# Patient Record
Sex: Female | Born: 1985 | Race: White | Hispanic: No | Marital: Married | Smoking: Never smoker
Health system: Southern US, Community
[De-identification: ages and names within clinical notes are randomized; demographics above are authoritative.]

## PROBLEM LIST (undated history)

## (undated) ENCOUNTER — Inpatient Hospital Stay (HOSPITAL_COMMUNITY): Payer: Self-pay

## (undated) DIAGNOSIS — F419 Anxiety disorder, unspecified: Secondary | ICD-10-CM

## (undated) DIAGNOSIS — O139 Gestational [pregnancy-induced] hypertension without significant proteinuria, unspecified trimester: Secondary | ICD-10-CM

## (undated) DIAGNOSIS — F32A Depression, unspecified: Secondary | ICD-10-CM

## (undated) DIAGNOSIS — J329 Chronic sinusitis, unspecified: Secondary | ICD-10-CM

## (undated) DIAGNOSIS — D696 Thrombocytopenia, unspecified: Secondary | ICD-10-CM

## (undated) DIAGNOSIS — F329 Major depressive disorder, single episode, unspecified: Secondary | ICD-10-CM

## (undated) DIAGNOSIS — O99119 Other diseases of the blood and blood-forming organs and certain disorders involving the immune mechanism complicating pregnancy, unspecified trimester: Secondary | ICD-10-CM

## (undated) HISTORY — DX: Other diseases of the blood and blood-forming organs and certain disorders involving the immune mechanism complicating pregnancy, unspecified trimester: D69.6

## (undated) HISTORY — PX: SINUS EXPLORATION: SHX5214

## (undated) HISTORY — PX: WISDOM TOOTH EXTRACTION: SHX21

## (undated) HISTORY — DX: Thrombocytopenia, unspecified: O99.119

---

## 2005-03-28 DIAGNOSIS — O1490 Unspecified pre-eclampsia, unspecified trimester: Secondary | ICD-10-CM

## 2005-08-27 ENCOUNTER — Inpatient Hospital Stay (HOSPITAL_COMMUNITY): Admission: AD | Admit: 2005-08-27 | Discharge: 2005-08-27 | Payer: Self-pay | Admitting: Obstetrics

## 2005-08-29 ENCOUNTER — Inpatient Hospital Stay (HOSPITAL_COMMUNITY): Admission: AD | Admit: 2005-08-29 | Discharge: 2005-09-01 | Payer: Self-pay | Admitting: Obstetrics

## 2007-11-29 ENCOUNTER — Ambulatory Visit (HOSPITAL_COMMUNITY): Admission: RE | Admit: 2007-11-29 | Discharge: 2007-11-29 | Payer: Self-pay | Admitting: Obstetrics & Gynecology

## 2008-01-29 ENCOUNTER — Inpatient Hospital Stay (HOSPITAL_COMMUNITY): Admission: AD | Admit: 2008-01-29 | Discharge: 2008-01-29 | Payer: Self-pay | Admitting: Obstetrics & Gynecology

## 2008-03-10 ENCOUNTER — Inpatient Hospital Stay (HOSPITAL_COMMUNITY): Admission: AD | Admit: 2008-03-10 | Discharge: 2008-03-10 | Payer: Self-pay | Admitting: Obstetrics

## 2008-03-28 DIAGNOSIS — O1490 Unspecified pre-eclampsia, unspecified trimester: Secondary | ICD-10-CM

## 2008-04-17 ENCOUNTER — Inpatient Hospital Stay (HOSPITAL_COMMUNITY): Admission: RE | Admit: 2008-04-17 | Discharge: 2008-04-20 | Payer: Self-pay | Admitting: Obstetrics & Gynecology

## 2010-07-12 LAB — CBC
HCT: 28.4 % — ABNORMAL LOW (ref 36.0–46.0)
HCT: 32.9 % — ABNORMAL LOW (ref 36.0–46.0)
Hemoglobin: 10.1 g/dL — ABNORMAL LOW (ref 12.0–15.0)
Hemoglobin: 11.7 g/dL — ABNORMAL LOW (ref 12.0–15.0)
MCHC: 35.5 g/dL (ref 30.0–36.0)
MCV: 98.7 fL (ref 78.0–100.0)
MCV: 99.9 fL (ref 78.0–100.0)
RBC: 3.34 MIL/uL — ABNORMAL LOW (ref 3.87–5.11)
RDW: 12.4 % (ref 11.5–15.5)
WBC: 11 10*3/uL — ABNORMAL HIGH (ref 4.0–10.5)

## 2010-07-12 LAB — RH IMMUNE GLOB WKUP(>/=20WKS)(NOT WOMEN'S HOSP)

## 2010-12-28 LAB — RH IMMUNE GLOBULIN WORKUP (NOT WOMEN'S HOSP)
ABO/RH(D): O NEG
Antibody Screen: NEGATIVE

## 2010-12-31 LAB — CBC
HCT: 34.9 % — ABNORMAL LOW (ref 36.0–46.0)
MCHC: 35.5 g/dL (ref 30.0–36.0)
MCV: 100.4 fL — ABNORMAL HIGH (ref 78.0–100.0)
Platelets: 112 10*3/uL — ABNORMAL LOW (ref 150–400)

## 2011-03-29 NOTE — L&D Delivery Note (Signed)
Delivery Note At 11:27 AM a viable female was delivered via Vaginal, Spontaneous Delivery (Presentation: Left Occiput Anterior).  APGAR: 9, 9; weight 6 lb 15.5 oz (3160 g).   Placenta status: Intact, Spontaneous.  Cord: 3 vessels with the following complications: None.  Cord pH: none  Anesthesia: Other Epidural  Episiotomy: None Lacerations: None Suture Repair: none Est. Blood Loss (mL): 350  Mom to postpartum.  Baby to nursery-stable.  HARPER,CHARLES A 11/03/2011, 1:04 PM

## 2011-05-17 LAB — OB RESULTS CONSOLE RUBELLA ANTIBODY, IGM: Rubella: IMMUNE

## 2011-05-17 LAB — OB RESULTS CONSOLE ABO/RH: RH Type: NEGATIVE

## 2011-05-17 LAB — OB RESULTS CONSOLE HIV ANTIBODY (ROUTINE TESTING): HIV: NONREACTIVE

## 2011-07-29 ENCOUNTER — Inpatient Hospital Stay (HOSPITAL_COMMUNITY)
Admission: AD | Admit: 2011-07-29 | Discharge: 2011-07-29 | Disposition: A | Discharge status: Home / Self Care | Payer: BC Managed Care – PPO | Source: Ambulatory Visit | Attending: Obstetrics & Gynecology | Admitting: Obstetrics & Gynecology

## 2011-07-29 ENCOUNTER — Encounter (HOSPITAL_COMMUNITY): Payer: Self-pay | Admitting: *Deleted

## 2011-07-29 DIAGNOSIS — O99891 Other specified diseases and conditions complicating pregnancy: Secondary | ICD-10-CM | POA: Insufficient documentation

## 2011-07-29 DIAGNOSIS — O26899 Other specified pregnancy related conditions, unspecified trimester: Secondary | ICD-10-CM

## 2011-07-29 DIAGNOSIS — R109 Unspecified abdominal pain: Secondary | ICD-10-CM | POA: Insufficient documentation

## 2011-07-29 HISTORY — DX: Major depressive disorder, single episode, unspecified: F32.9

## 2011-07-29 HISTORY — DX: Anxiety disorder, unspecified: F41.9

## 2011-07-29 HISTORY — DX: Depression, unspecified: F32.A

## 2011-07-29 HISTORY — DX: Chronic sinusitis, unspecified: J32.9

## 2011-07-29 LAB — WET PREP, GENITAL: Yeast Wet Prep HPF POC: NONE SEEN

## 2011-07-29 NOTE — MAU Provider Note (Signed)
  History     CSN: [id number]  Arrival date and time: 07/29/11 1246   None     Chief Complaint  Patient presents with  . Abdominal Cramping   HPI   Pt is [redacted]w[redacted]d pregnant and complains  Of lower abdominal cramping- worse today.  No leakage of fluid or bleeding. Pt denies pain with urination, constipation or diarrhea.  Past Medical History  Diagnosis Date  . Sinus infection   . Asthma     "sinus induced"  . Anxiety   . Depression     Past Surgical History  Procedure Date  . Sinus exploration   . Wisdom tooth extraction     History reviewed. No pertinent family history.  History  Substance Use Topics  . Smoking status: Never Smoker   . Smokeless tobacco: Never Used  . Alcohol Use: No    Allergies: No Known Allergies  Prescriptions prior to admission  Medication Sig Dispense Refill  . butalbital-acetaminophen-caffeine (FIORICET, ESGIC) 50-325-40 MG per tablet Take 1 tablet by mouth 2 (two) times daily as needed. For pain      . loperamide (IMODIUM) 2 MG capsule Take 2 mg by mouth once. For diarrhea      . loratadine (CLARITIN) 10 MG tablet Take 10 mg by mouth daily.      Marland Kitchen omeprazole (PRILOSEC) 20 MG capsule Take 20 mg by mouth every morning.      . Prenatal Vit-Fe Fumarate-FA (PRENATAL MULTIVITAMIN) TABS Take 1 tablet by mouth every morning.        Review of Systems  Constitutional: Negative for fever and chills.  Gastrointestinal: Positive for nausea and abdominal pain. Negative for vomiting, diarrhea and constipation.  Genitourinary: Negative for dysuria and urgency.  Neurological: Negative for headaches.   Physical Exam   Blood pressure 109/61, pulse 83, temperature 97.1 F (36.2 C), temperature source Oral, resp. rate 16.  Physical Exam  Nursing note and vitals reviewed. Constitutional: She appears well-developed and well-nourished.  HENT:  Head: Normocephalic.  Eyes: Pupils are equal, round, and reactive to light.  Neck: Normal range of motion. Neck  supple.  Cardiovascular: Normal rate.   Respiratory: Effort normal.  GI: Soft. There is no tenderness. There is no rebound and no guarding.       No contractions palpated or picked up on toco.  Genitourinary:       Cervix closed- wet prep obtained  Musculoskeletal: Normal range of motion.  Neurological: She is alert.  Skin: Skin is warm and dry.  Psychiatric: She has a normal mood and affect.    MAU Course  Procedures Fetal monitoring with 6-25bpm variability No ctx palpated or on toco Results for orders placed during the hospital encounter of 07/29/11 (from the past 24 hour(s))  WET PREP, GENITAL     Status: Abnormal   Collection Time   07/29/11  2:10 PM      Component Value Range   Yeast Wet Prep HPF POC NONE SEEN  NONE SEEN    Trich, Wet Prep NONE SEEN  NONE SEEN    Clue Cells Wet Prep HPF POC NONE SEEN  NONE SEEN    WBC, Wet Prep HPF POC FEW (*) NONE SEEN    Assessment and Plan  Round ligament pain F/u with OB appointment Kenitha Glendinning 07/29/2011, 3:23 PM

## 2011-07-29 NOTE — MAU Note (Signed)
Low abdominal cramping X 2 days worse last night, unable to sleep. Thick vaginal D/C with odor.

## 2011-08-11 ENCOUNTER — Other Ambulatory Visit: Payer: Self-pay

## 2011-08-15 ENCOUNTER — Encounter (HOSPITAL_COMMUNITY): Payer: Self-pay | Admitting: *Deleted

## 2011-08-15 ENCOUNTER — Inpatient Hospital Stay (HOSPITAL_COMMUNITY)
Admission: AD | Admit: 2011-08-15 | Discharge: 2011-08-15 | Disposition: A | Discharge status: Home / Self Care | Payer: BC Managed Care – PPO | Source: Ambulatory Visit | Attending: Obstetrics & Gynecology | Admitting: Obstetrics & Gynecology

## 2011-08-15 DIAGNOSIS — R109 Unspecified abdominal pain: Secondary | ICD-10-CM | POA: Insufficient documentation

## 2011-08-15 DIAGNOSIS — N949 Unspecified condition associated with female genital organs and menstrual cycle: Secondary | ICD-10-CM

## 2011-08-15 DIAGNOSIS — O26899 Other specified pregnancy related conditions, unspecified trimester: Secondary | ICD-10-CM

## 2011-08-15 DIAGNOSIS — O99891 Other specified diseases and conditions complicating pregnancy: Secondary | ICD-10-CM | POA: Insufficient documentation

## 2011-08-15 DIAGNOSIS — M545 Low back pain, unspecified: Secondary | ICD-10-CM | POA: Insufficient documentation

## 2011-08-15 DIAGNOSIS — O9989 Other specified diseases and conditions complicating pregnancy, childbirth and the puerperium: Secondary | ICD-10-CM

## 2011-08-15 LAB — URINALYSIS, ROUTINE W REFLEX MICROSCOPIC
Bilirubin Urine: NEGATIVE
Glucose, UA: NEGATIVE mg/dL
Ketones, ur: NEGATIVE mg/dL
Leukocytes, UA: NEGATIVE
Specific Gravity, Urine: 1.01 (ref 1.005–1.030)
pH: 6 (ref 5.0–8.0)

## 2011-08-15 MED ORDER — CYCLOBENZAPRINE HCL 5 MG PO TABS: 5.0000 mg | ORAL_TABLET | Freq: Three times a day (TID) | ORAL | Status: AC | PRN

## 2011-08-15 NOTE — MAU Note (Signed)
Patient states she has had intermittent low abdominal and low back pain since this am. Denies any bleeding or leaking and reports good fetal movement.

## 2011-08-15 NOTE — Discharge Instructions (Signed)
The medication for muscle pain may make you sleepy. Do not take if you are driving or doing any activity that may cause injury.  Back Pain in Pregnancy Back pain during pregnancy is common. It happens in about half of all pregnancies. It is important for you and your baby that you remain active during your pregnancy.If you feel that back pain is not allowing you to remain active or sleep well, it is time to see your caregiver. Back pain may be caused by several factors related to changes during your pregnancy.Fortunately, unless you had trouble with your back before your pregnancy, the pain is likely to get better after you deliver. Low back pain usually occurs between the fifth and seventh months of pregnancy. It can, however, happen in the first couple months. Factors that increase the risk of back problems include:   Previous back problems.   Injury to your back.   Having twins or multiple births.   A chronic cough.   Stress.   Job-related repetitive motions.   Muscle or spinal disease in the back.   Family history of back problems, ruptured (herniated) discs, or osteoporosis.   Depression, anxiety, and panic attacks.  CAUSES   When you are pregnant, your body produces a hormone called relaxin. This hormonemakes the ligaments connecting the low back and pubic bones more flexible. This flexibility allows the baby to be delivered more easily. When your ligaments are loose, your muscles need to work harder to support your back. Soreness in your back can come from tired muscles. Soreness can also come from back tissues that are irritated since they are receiving less support.   As the baby grows, it puts pressure on the nerves and blood vessels in your pelvis. This can cause back pain.   As the baby grows and gets heavier during pregnancy, the uterus pushes the stomach muscles forward and changes your center of gravity. This makes your back muscles work harder to maintain good posture.   SYMPTOMS  Lumbar pain during pregnancy Lumbar pain during pregnancy usually occurs at or above the waist in the center of the back. There may be pain and numbness that radiates into your leg or foot. This is similar to low back pain experienced by non-pregnant women. It usually increases with sitting for long periods of time, standing, or repetitive lifting. Tenderness may also be present in the muscles along your upper back. Posterior pelvic pain during pregnancy Pain in the back of the pelvis is more common than lumbar pain in pregnancy. It is a deep pain felt in your side at the waistline, or across the tailbone (sacrum), or in both places. You may have pain on one or both sides. This pain can also go into the buttocks and backs of the upper thighs. Pubic and groin pain may also be present. The pain does not quickly resolve with rest, and morning stiffness may also be present. Pelvic pain during pregnancy can be brought on by most activities. A high level of fitness before and during pregnancy may or may not prevent this problem. Labor pain is usually 1 to 2 minutes apart, lasts for about 1 minute, and involves a bearing down feeling or pressure in your pelvis. However, if you are at term with the pregnancy, constant low back pain can be the beginning of early labor, and you should be aware of this. DIAGNOSIS  X-rays of the back should not be done during the first 12 to 14 weeks of the  pregnancy and only when absolutely necessary during the rest of the pregnancy. MRIs do not give off radiation and are safe during pregnancy. MRIs also should only be done when absolutely necessary. HOME CARE INSTRUCTIONS  Exercise as directed by your caregiver. Exercise is the most effective way to prevent or manage back pain. If you have a back problem, it is especially important to avoid sports that require sudden body movements. Swimming and walking are great activities.   Do not stand in one place for long periods  of time.   Do not wear high heels.   Sit in chairs with good posture. Use a pillow on your lower back if necessary. Make sure your head rests over your shoulders and is not hanging forward.   Try sleeping on your side, preferably the left side, with a pillow or two between your legs. If you are sore after a night's rest, your bedmay betoo soft.Try placing a board between your mattress and box spring.   Listen to your body when lifting.If you are experiencing pain, ask for help or try bending yourknees more so you can use your leg muscles rather than your back muscles. Squat down when picking up something from the floor. Do not bend over.   Eat a healthy diet. Try to gain weight within your caregiver's recommendations.   Use heat or cold packs 3 to 4 times a day for 15 minutes to help with the pain.   Only take over-the-counter or prescription medicines for pain, discomfort, or fever as directed by your caregiver.  Sudden (acute) back pain  Use bed rest for only the most extreme, acute episodes of back pain. Prolonged bed rest over 48 hours will aggravate your condition.   Ice is very effective for acute conditions.   Put ice in a plastic bag.   Place a towel between your skin and the bag.   Leave the ice on for 10 to 20 minutes every 2 hours, or as needed.   Using heat packs for 30 minutes prior to activities is also helpful.  Continued back pain See your caregiver if you have continued problems. Your caregiver can help or refer you for appropriate physical therapy. With conditioning, most back problems can be avoided. Sometimes, a more serious issue may be the cause of back pain. You should be seen right away if new problems seem to be developing. Your caregiver may recommend:  A maternity girdle.   An elastic sling.   A back brace.   A massage therapist or acupuncture.  SEEK MEDICAL CARE IF:   You are not able to do most of your daily activities, even when taking the  pain medicine you were given.   You need a referral to a physical therapist or chiropractor.   You want to try acupuncture.  SEEK IMMEDIATE MEDICAL CARE IF:  You develop numbness, tingling, weakness, or problems with the use of your arms or legs.   You develop severe back pain that is no longer relieved with medicines.   You have a sudden change in bowel or bladder control.   You have increasing pain in other areas of the body.   You develop shortness of breath, dizziness, or fainting.   You develop nausea, vomiting, or sweating.   You have back pain which is similar to labor pains.   You have back pain along with your water breaking or vaginal bleeding.   You have back pain or numbness that travels down your leg.  Your back pain developed after you fell.   You develop pain on one side of your back. You may have a kidney stone.   You see blood in your urine. You may have a bladder infection or kidney stone.   You have back pain with blisters. You may have shingles.  Back pain is fairly common during pregnancy but should not be accepted as just part of the process. Back pain should always be treated as soon as possible. This will make your pregnancy as pleasant as possible. Document Released: 06/22/2005 Document Revised: 03/03/2011 Document Reviewed: 08/03/2010 [facility] Patient Information 2012 [place], [place].Back Pain in Pregnancy Back pain during pregnancy is common. It happens in about half of all pregnancies. It is important for you and your baby that you remain active during your pregnancy.If you feel that back pain is not allowing you to remain active or sleep well, it is time to see your caregiver. Back pain may be caused by several factors related to changes during your pregnancy.Fortunately, unless you had trouble with your back before your pregnancy, the pain is likely to get better after you deliver. Low back pain usually occurs between the fifth and seventh months  of pregnancy. It can, however, happen in the first couple months. Factors that increase the risk of back problems include:   Previous back problems.   Injury to your back.   Having twins or multiple births.   A chronic cough.   Stress.   Job-related repetitive motions.   Muscle or spinal disease in the back.   Family history of back problems, ruptured (herniated) discs, or osteoporosis.   Depression, anxiety, and panic attacks.  CAUSES   When you are pregnant, your body produces a hormone called relaxin. This hormonemakes the ligaments connecting the low back and pubic bones more flexible. This flexibility allows the baby to be delivered more easily. When your ligaments are loose, your muscles need to work harder to support your back. Soreness in your back can come from tired muscles. Soreness can also come from back tissues that are irritated since they are receiving less support.   As the baby grows, it puts pressure on the nerves and blood vessels in your pelvis. This can cause back pain.   As the baby grows and gets heavier during pregnancy, the uterus pushes the stomach muscles forward and changes your center of gravity. This makes your back muscles work harder to maintain good posture.  SYMPTOMS  Lumbar pain during pregnancy Lumbar pain during pregnancy usually occurs at or above the waist in the center of the back. There may be pain and numbness that radiates into your leg or foot. This is similar to low back pain experienced by non-pregnant women. It usually increases with sitting for long periods of time, standing, or repetitive lifting. Tenderness may also be present in the muscles along your upper back. Posterior pelvic pain during pregnancy Pain in the back of the pelvis is more common than lumbar pain in pregnancy. It is a deep pain felt in your side at the waistline, or across the tailbone (sacrum), or in both places. You may have pain on one or both sides. This pain can  also go into the buttocks and backs of the upper thighs. Pubic and groin pain may also be present. The pain does not quickly resolve with rest, and morning stiffness may also be present. Pelvic pain during pregnancy can be brought on by most activities. A high level of fitness before  and during pregnancy may or may not prevent this problem. Labor pain is usually 1 to 2 minutes apart, lasts for about 1 minute, and involves a bearing down feeling or pressure in your pelvis. However, if you are at term with the pregnancy, constant low back pain can be the beginning of early labor, and you should be aware of this. DIAGNOSIS  X-rays of the back should not be done during the first 12 to 14 weeks of the pregnancy and only when absolutely necessary during the rest of the pregnancy. MRIs do not give off radiation and are safe during pregnancy. MRIs also should only be done when absolutely necessary. HOME CARE INSTRUCTIONS  Exercise as directed by your caregiver. Exercise is the most effective way to prevent or manage back pain. If you have a back problem, it is especially important to avoid sports that require sudden body movements. Swimming and walking are great activities.   Do not stand in one place for long periods of time.   Do not wear high heels.   Sit in chairs with good posture. Use a pillow on your lower back if necessary. Make sure your head rests over your shoulders and is not hanging forward.   Try sleeping on your side, preferably the left side, with a pillow or two between your legs. If you are sore after a night's rest, your bedmay betoo soft.Try placing a board between your mattress and box spring.   Listen to your body when lifting.If you are experiencing pain, ask for help or try bending yourknees more so you can use your leg muscles rather than your back muscles. Squat down when picking up something from the floor. Do not bend over.   Eat a healthy diet. Try to gain weight within  your caregiver's recommendations.   Use heat or cold packs 3 to 4 times a day for 15 minutes to help with the pain.   Only take over-the-counter or prescription medicines for pain, discomfort, or fever as directed by your caregiver.  Sudden (acute) back pain  Use bed rest for only the most extreme, acute episodes of back pain. Prolonged bed rest over 48 hours will aggravate your condition.   Ice is very effective for acute conditions.   Put ice in a plastic bag.   Place a towel between your skin and the bag.   Leave the ice on for 10 to 20 minutes every 2 hours, or as needed.   Using heat packs for 30 minutes prior to activities is also helpful.  Continued back pain See your caregiver if you have continued problems. Your caregiver can help or refer you for appropriate physical therapy. With conditioning, most back problems can be avoided. Sometimes, a more serious issue may be the cause of back pain. You should be seen right away if new problems seem to be developing. Your caregiver may recommend:  A maternity girdle.   An elastic sling.   A back brace.   A massage therapist or acupuncture.  SEEK MEDICAL CARE IF:   You are not able to do most of your daily activities, even when taking the pain medicine you were given.   You need a referral to a physical therapist or chiropractor.   You want to try acupuncture.  SEEK IMMEDIATE MEDICAL CARE IF:  You develop numbness, tingling, weakness, or problems with the use of your arms or legs.   You develop severe back pain that is no longer relieved with medicines.  You have a sudden change in bowel or bladder control.   You have increasing pain in other areas of the body.   You develop shortness of breath, dizziness, or fainting.   You develop nausea, vomiting, or sweating.   You have back pain which is similar to labor pains.   You have back pain along with your water breaking or vaginal bleeding.   You have back pain or  numbness that travels down your leg.   Your back pain developed after you fell.   You develop pain on one side of your back. You may have a kidney stone.   You see blood in your urine. You may have a bladder infection or kidney stone.   You have back pain with blisters. You may have shingles.  Back pain is fairly common during pregnancy but should not be accepted as just part of the process. Back pain should always be treated as soon as possible. This will make your pregnancy as pleasant as possible. Document Released: 06/22/2005 Document Revised: 03/03/2011 Document Reviewed: 08/03/2010 [facility] Patient Information 2012 [place], [place].

## 2011-08-15 NOTE — MAU Provider Note (Signed)
History     CSN: [id number]  Arrival date & time 08/15/11  1813   None     Chief Complaint  Patient presents with  . Abdominal Pain  . Back Pain    HPI [name] is a 26 y.o. female @ [redacted]w[redacted]d gestation who presents to MAU for lower abdominal pain that radiates to her back. The pain started this morning and has continued. She describes the pain as sharp that comes and goes. It last about 2 minutes when it comes. The pain has come several times today. The pain increases with standing and walking. Problems with constipation. The history was provided by the patient.  Past Medical History  Diagnosis Date  . Sinus infection   . Asthma     "sinus induced"  . Anxiety   . Depression     Past Surgical History  Procedure Date  . Sinus exploration   . Wisdom tooth extraction     Family History  Problem Relation Age of Onset  . Anesthesia problems Neg Hx     History  Substance Use Topics  . Smoking status: Never Smoker   . Smokeless tobacco: Never Used  . Alcohol Use: No    OB History    Grav Para Term Preterm Abortions TAB SAB Ect Mult Living   3 2 2  0 0 0 0 0 0 2      Review of Systems  Constitutional: Negative for fever, chills, diaphoresis and fatigue.  HENT: Negative for ear pain, congestion, sore throat, facial swelling, neck pain, neck stiffness, dental problem and sinus pressure.   Eyes: Negative for photophobia, pain and discharge.  Respiratory: Negative for cough, chest tightness and wheezing.   Cardiovascular: Negative for chest pain, palpitations and leg swelling.  Gastrointestinal: Positive for nausea, abdominal pain and constipation. Negative for vomiting, diarrhea and abdominal distention.  Genitourinary: Positive for pelvic pain. Negative for dysuria, urgency, frequency, flank pain, vaginal bleeding, vaginal discharge and difficulty urinating.  Musculoskeletal: Positive for back pain. Negative for myalgias and gait problem.  Skin: Negative  for color change and rash.  Neurological: Positive for light-headedness and headaches. Negative for dizziness, speech difficulty, weakness and numbness.  Psychiatric/Behavioral: Negative for confusion and agitation.    Allergies  Review of patient's allergies indicates no known allergies.  Home Medications  No current outpatient prescriptions on file.  BP 126/80  Pulse 91  Temp(Src) 97.4 F (36.3 C) (Oral)  Resp 16  Ht 5\' 5"  (1.651 m)  Wt 201 lb 12.8 oz (91.536 kg)  BMI 33.58 kg/m2  SpO2 100%  Physical Exam  Nursing note and vitals reviewed. Constitutional: She is oriented to person, place, and time. She appears well-developed and well-nourished.  HENT:  Head: Normocephalic.  Eyes: EOM are normal.  Neck: Neck supple.  Cardiovascular: Normal rate.   Pulmonary/Chest: Effort normal.  Abdominal: Soft. There is no tenderness.       Gravid, consistent with dates  Genitourinary:       External genitalia without lesions, mucous discharge vaginal vault. Cervix closed, thick and high. Uterus consistent with dates.  Musculoskeletal: Normal range of motion.       Pain lower lumbar area with palpation and range of motion. Ambulatory without problems.  Neurological: She is alert and oriented to person, place, and time. She has normal strength and normal reflexes. No cranial nerve deficit or sensory deficit. Gait normal.  Skin: Skin is warm and dry.  Psychiatric: She has a normal mood and affect. Her  behavior is normal. Judgment normal.   EFM: No contractions, reactive tracing. ED Course  Procedures   MDM: discussed with Dr. Gaynell Face. Will treat as muscle strain.    Assessment: Low back strain   Round ligament pain  Plan:  Tylenol prn   Flexeril 5 mg. PO tid   Follow up with Dr. Clearance Coots   Return here as needed.

## 2011-08-16 ENCOUNTER — Other Ambulatory Visit: Payer: Self-pay | Admitting: Obstetrics

## 2011-08-26 ENCOUNTER — Other Ambulatory Visit: Payer: Self-pay | Admitting: Obstetrics

## 2011-08-26 DIAGNOSIS — O4190X0 Disorder of amniotic fluid and membranes, unspecified, unspecified trimester, not applicable or unspecified: Secondary | ICD-10-CM

## 2011-08-29 ENCOUNTER — Other Ambulatory Visit: Payer: Self-pay | Admitting: Obstetrics

## 2011-08-29 ENCOUNTER — Ambulatory Visit (HOSPITAL_COMMUNITY)
Admission: RE | Admit: 2011-08-29 | Discharge: 2011-08-29 | Disposition: A | Discharge status: Home / Self Care | Payer: BC Managed Care – PPO | Source: Ambulatory Visit | Attending: Obstetrics | Admitting: Obstetrics

## 2011-08-29 DIAGNOSIS — Z3689 Encounter for other specified antenatal screening: Secondary | ICD-10-CM | POA: Insufficient documentation

## 2011-08-29 DIAGNOSIS — O4190X0 Disorder of amniotic fluid and membranes, unspecified, unspecified trimester, not applicable or unspecified: Secondary | ICD-10-CM

## 2011-09-06 ENCOUNTER — Other Ambulatory Visit: Payer: Self-pay | Admitting: Obstetrics

## 2011-09-06 DIAGNOSIS — O99119 Other diseases of the blood and blood-forming organs and certain disorders involving the immune mechanism complicating pregnancy, unspecified trimester: Secondary | ICD-10-CM

## 2011-09-06 DIAGNOSIS — Z3689 Encounter for other specified antenatal screening: Secondary | ICD-10-CM

## 2011-09-09 ENCOUNTER — Ambulatory Visit (HOSPITAL_COMMUNITY)
Admission: RE | Admit: 2011-09-09 | Discharge: 2011-09-09 | Disposition: A | Discharge status: Home / Self Care | Payer: BC Managed Care – PPO | Source: Ambulatory Visit | Attending: Obstetrics | Admitting: Obstetrics

## 2011-09-09 ENCOUNTER — Other Ambulatory Visit: Payer: Self-pay | Admitting: Obstetrics

## 2011-09-09 ENCOUNTER — Inpatient Hospital Stay (HOSPITAL_COMMUNITY)
Admission: AD | Admit: 2011-09-09 | Discharge: 2011-09-09 | Disposition: A | Discharge status: Home / Self Care | Payer: BC Managed Care – PPO | Source: Ambulatory Visit | Attending: Obstetrics & Gynecology | Admitting: Obstetrics & Gynecology

## 2011-09-09 ENCOUNTER — Encounter (HOSPITAL_COMMUNITY): Payer: Self-pay

## 2011-09-09 DIAGNOSIS — Z298 Encounter for other specified prophylactic measures: Secondary | ICD-10-CM | POA: Insufficient documentation

## 2011-09-09 DIAGNOSIS — O99119 Other diseases of the blood and blood-forming organs and certain disorders involving the immune mechanism complicating pregnancy, unspecified trimester: Secondary | ICD-10-CM

## 2011-09-09 DIAGNOSIS — Z2989 Encounter for other specified prophylactic measures: Secondary | ICD-10-CM | POA: Insufficient documentation

## 2011-09-09 DIAGNOSIS — D689 Coagulation defect, unspecified: Secondary | ICD-10-CM | POA: Insufficient documentation

## 2011-09-09 DIAGNOSIS — Z3689 Encounter for other specified antenatal screening: Secondary | ICD-10-CM

## 2011-09-09 DIAGNOSIS — D696 Thrombocytopenia, unspecified: Secondary | ICD-10-CM

## 2011-09-09 MED ORDER — RHO D IMMUNE GLOBULIN 1500 UNIT/2ML IJ SOLN
300.0000 ug | Freq: Once | INTRAMUSCULAR | Status: AC
  Administered 2011-09-09: 300 ug via INTRAMUSCULAR
  Filled 2011-09-09: qty 2

## 2011-09-09 NOTE — MAU Note (Signed)
Dr. Tamela Oddi called [facility]:[id number] needing to be placed for pt sent over to receive rhophylac.

## 2011-09-09 NOTE — Progress Notes (Signed)
MFM note  [name] is a 26 yo G3P2002 currently at 57 5/7 weeks who is referred due to mild thrombocytopenia.  Her most recent plt count on 08/11/2011 was 116K.  Her new OB plts were 115K in February 2013.  Review of labs showed a plt count of 112 in 2009.  The patient reports that she has been followed for low platelets for about 3 years.  She reports that she was briefly given steroids for ? Low plts after her last delivery.  She had a limited work up several years ago by a physician in [place] (records not available for review at the time of consultation).  She states that there was some concern for Lupus, but that it was later felt to be negative.  She denies bleeding problems - no expistaxis.  She feels that she bruises easily, but no hx of hemorrhage or other problems.  Her prenatal course has otherwise been uncomplicated.  Her previous children did not have problems with low platelets after birth.  POB hx: Term SVD x 2.  First pregnancy complicated by elevated blood pressures.  PMH- as above. Migraine headaches. Denies arthritis/ arthalgias, skin rashes or photosensitivity  PSH- sinus surgery as a child - reports that she had a single nostril at birth that was repaired  Meds - Prenatal vitamins, Omeprazole, Fiorocet  Social - denies ETOH or tobacco abuse  Ultrasound:  Single IUP at 31 5/7 weeks On some views, there appears to be an isolated cleft palate but not confirmed on other images.  The lips / nose appear normal. The remainder of the anatomy appears within normal limits. The estimated fetal weight is appropriate (33rd percentile) Normal amniotic fluid volume   Impression/ Plan: 1) Mild thrombocytopenia - possible gestational thrombocytopenia.  Platelet counts have been stable throughout pregnancy and over the last several years.   Recommend checking plt counts monthly for the remainder of pregnancy.  We briefly discussed the possibility of not being a candidate for  epidural anesthesia if her plt count were < 100K.  If this becomes an issue, would recommend an anesthesia consult to address labor anesthesia.  Would not recommend treatment with steroids unless plts returned < 50K.   2) Possible isolated cleft palate - finding not confirmed on some images. Recommend that Peds be notified at the time of delivery to evaluate the newborn.  Will discuss a our monthly [facility] meeting.  Would not recommend further evaluation at this point.  Thank you for this referral.  Please contact our office if we can be of further assistance.  Alpha Gula, MD   I spent approximately 30 minutes with this patient with over 50% of time spent in face-to-face counseling

## 2011-09-09 NOTE — MAU Note (Signed)
Pt given and reviewed RH negative pamphlet.

## 2011-09-09 NOTE — MAU Note (Signed)
No adverse effect from rhophylac 

## 2011-09-10 LAB — RH IG WORKUP (INCLUDES ABO/RH)
ABO/RH(D): O NEG
Antibody Screen: NEGATIVE
Fetal Screen: NEGATIVE
Unit division: 0

## 2011-10-01 ENCOUNTER — Inpatient Hospital Stay (HOSPITAL_COMMUNITY)
Admission: AD | Admit: 2011-10-01 | Discharge: 2011-10-01 | Disposition: A | Discharge status: Home / Self Care | Payer: BC Managed Care – PPO | Source: Ambulatory Visit | Attending: Obstetrics | Admitting: Obstetrics

## 2011-10-01 ENCOUNTER — Encounter (HOSPITAL_COMMUNITY): Payer: Self-pay | Admitting: Obstetrics and Gynecology

## 2011-10-01 DIAGNOSIS — O99891 Other specified diseases and conditions complicating pregnancy: Secondary | ICD-10-CM | POA: Insufficient documentation

## 2011-10-01 DIAGNOSIS — O479 False labor, unspecified: Secondary | ICD-10-CM

## 2011-10-01 DIAGNOSIS — IMO0002 Reserved for concepts with insufficient information to code with codable children: Secondary | ICD-10-CM | POA: Insufficient documentation

## 2011-10-01 DIAGNOSIS — H538 Other visual disturbances: Secondary | ICD-10-CM | POA: Insufficient documentation

## 2011-10-01 LAB — URINALYSIS, ROUTINE W REFLEX MICROSCOPIC
Bilirubin Urine: NEGATIVE
Hgb urine dipstick: NEGATIVE
Protein, ur: NEGATIVE mg/dL
Urobilinogen, UA: 0.2 mg/dL (ref 0.0–1.0)

## 2011-10-01 NOTE — MAU Note (Signed)
Onset of fatigue and blurred vision for 3 days worse today, 34.6 gestation here for evaluation.

## 2011-10-01 NOTE — MAU Provider Note (Signed)
Lauren Garcia is a 26 y.o. female presenting for eval of fatigue and blurry vision x 3 days. Denies RUQ pain, headache, leaking or bldg. Reports +FM. States her diet has been mostly simple carbs lately and without protein. Her preg has been followed by the [facility] office and has been remarkable for thrombocytopenia and possibly cleft lip. She reports blurry vision with her first preg as well which resolved spontaneously after delivery. History OB History    Grav Para Term Preterm Abortions TAB SAB Ect Mult Living   3 2 2  0 0 0 0 0 0 2     Past Medical History  Diagnosis Date  . Sinus infection   . Asthma     "sinus induced"  . Anxiety   . Depression    Past Surgical History  Procedure Date  . Sinus exploration   . Wisdom tooth extraction    Family History: family history is negative for Anesthesia problems. Social History:  reports that she has never smoked. She has never used smokeless tobacco. She reports that she does not drink alcohol or use illicit drugs.    ROS    Blood pressure 109/73, pulse 112, temperature 97.8 F (36.6 C), temperature source Oral, resp. rate 18, height 5\' 6"  (1.676 m), weight 97.433 kg (214 lb 12.8 oz).  Additional BPs: 103/60, 111/75  Maternal Exam:  Uterine Assessment: Rare mild ctx; no pattern     Fetal Exam Fetal Monitor Review: Baseline rate: 140.  Variability: moderate (6-25 bpm).   Pattern: accelerations present and no decelerations.    Fetal State Assessment: Category I - tracings are normal.     Physical Exam  Constitutional: She is oriented to person, place, and time. She appears well-developed and well-nourished.  HENT:  Head: Normocephalic.  Cardiovascular:       Sl tachycardic  Respiratory: Effort normal.  Musculoskeletal: Normal range of motion. She exhibits no edema.  Neurological: She is alert and oriented to person, place, and time. She has normal reflexes.  Skin: Skin is warm and dry.  Psychiatric: She  has a normal mood and affect. Her behavior is normal. Thought content normal.    Urinalysis    Component Value Date/Time   COLORURINE YELLOW 10/01/2011 1125   APPEARANCEUR CLEAR 10/01/2011 1125   LABSPEC <1.005* 10/01/2011 1125   PHURINE 6.0 10/01/2011 1125   GLUCOSEU NEGATIVE 10/01/2011 1125   HGBUR NEGATIVE 10/01/2011 1125   BILIRUBINUR NEGATIVE 10/01/2011 1125   KETONESUR NEGATIVE 10/01/2011 1125   PROTEINUR NEGATIVE 10/01/2011 1125   UROBILINOGEN 0.2 10/01/2011 1125   NITRITE NEGATIVE 10/01/2011 1125   LEUKOCYTESUR NEGATIVE 10/01/2011 1125    Assessment/Plan: IUP at 34.6 Third trimester discomforts Blurry vision without s/s preeclampsia  D/C home with rec to increase protein in diet May need to see eye doctor if blurry vision persists  Keep next scheduled visit with Jodene Nam 10/01/2011, 12:31 PM

## 2011-10-09 ENCOUNTER — Inpatient Hospital Stay (HOSPITAL_COMMUNITY)
Admission: AD | Admit: 2011-10-09 | Discharge: 2011-10-09 | Disposition: A | Discharge status: Hospice | Payer: BC Managed Care – PPO | Source: Ambulatory Visit | Attending: Obstetrics & Gynecology | Admitting: Obstetrics & Gynecology

## 2011-10-09 ENCOUNTER — Encounter (HOSPITAL_COMMUNITY): Payer: Self-pay | Admitting: *Deleted

## 2011-10-09 DIAGNOSIS — M549 Dorsalgia, unspecified: Secondary | ICD-10-CM | POA: Insufficient documentation

## 2011-10-09 DIAGNOSIS — R109 Unspecified abdominal pain: Secondary | ICD-10-CM | POA: Insufficient documentation

## 2011-10-09 DIAGNOSIS — O4690 Antepartum hemorrhage, unspecified, unspecified trimester: Secondary | ICD-10-CM | POA: Insufficient documentation

## 2011-10-09 LAB — URINALYSIS, ROUTINE W REFLEX MICROSCOPIC
Bilirubin Urine: NEGATIVE
Glucose, UA: NEGATIVE mg/dL
Hgb urine dipstick: NEGATIVE
Ketones, ur: NEGATIVE mg/dL
Protein, ur: NEGATIVE mg/dL

## 2011-10-09 NOTE — MAU Note (Signed)
Pt G3 P2 at 36wks.  Blood on tissue after using the bathroom and blood in toilet.  Lower abd cramping and back pain.  Low platelets-last week 135.

## 2011-10-09 NOTE — MAU Provider Note (Signed)
History     CSN: [id number]  Arrival date and time: 10/09/11 2107   First Provider Initiated Contact with Patient 10/09/11 2317      Chief Complaint  Patient presents with  . Vaginal Bleeding  . Abdominal Pain  . Back Pain   HPI This is a 26 y.o. female at 100w0d who presents with c/o seeing blood on tissue when wiping. Was trying to have a BM but was constipated. Has irregular cramps. No recent intercourse. Denies N/V/D or fever.   OB History    Grav Para Term Preterm Abortions TAB SAB Ect Mult Living   3 2 2  0 0 0 0 0 0 2      Past Medical History  Diagnosis Date  . Sinus infection   . Asthma     "sinus induced"  . Anxiety   . Depression     Past Surgical History  Procedure Date  . Sinus exploration   . Wisdom tooth extraction     Family History  Problem Relation Age of Onset  . Anesthesia problems Neg Hx   . Arthritis Mother   . Diabetes Maternal Grandmother   . Hypertension Maternal Grandmother   . Cancer Maternal Grandmother   . COPD Maternal Grandmother   . Arthritis Maternal Grandmother   . Diabetes Paternal Grandmother   . Hypertension Paternal Grandmother   . Arthritis Paternal Grandmother     History  Substance Use Topics  . Smoking status: Never Smoker   . Smokeless tobacco: Never Used  . Alcohol Use: No    Allergies: No Known Allergies  Prescriptions prior to admission  Medication Sig Dispense Refill  . acetaminophen (TYLENOL) 500 MG tablet Take 1,000 mg by mouth every 6 (six) hours as needed. pain      . butalbital-acetaminophen-caffeine (FIORICET, ESGIC) 50-325-40 MG per tablet Take 1 tablet by mouth 2 (two) times daily as needed. Head aches      . loratadine (CLARITIN) 10 MG tablet Take 10 mg by mouth daily.      Marland Kitchen omeprazole (PRILOSEC) 20 MG capsule Take 20 mg by mouth every morning.      . Prenatal Vit-Fe Fumarate-FA (PRENATAL MULTIVITAMIN) TABS Take 1 tablet by mouth every morning.      Marland Kitchen PRESCRIPTION MEDICATION Take 1 tablet by  mouth daily. Fe/stool softener combination, pt states she was given samples at a clinic and does not know strength.      . promethazine (PHENERGAN) 25 MG tablet Take 25 mg by mouth every 6 (six) hours as needed. For nausea      . traMADol (ULTRAM) 50 MG tablet Take 100 mg by mouth every 6 (six) hours as needed. For pain        ROS As listed in HPI  Physical Exam   Blood pressure 114/72, pulse 90, temperature 98.4 F (36.9 C), temperature source Oral, resp. rate 16, height 5\' 6"  (1.676 m), weight 223 lb 12.8 oz (101.515 kg).  Physical Exam  Constitutional: She is oriented to person, place, and time. She appears well-developed and well-nourished. No distress.  Cardiovascular: Normal rate.   Respiratory: Effort normal.  GI: Soft. She exhibits no distension and no mass. There is no tenderness. There is no rebound and no guarding.  Genitourinary: Vagina normal and uterus normal. No vaginal discharge found.       No blood in vault at all. Only scant white discharge. Cervix closed/20%/-3/ballotable  Musculoskeletal: Normal range of motion.  Neurological: She is alert and oriented to  person, place, and time.  Skin: Skin is warm and dry.  Psychiatric: She has a normal mood and affect.  FHR reactive Rare contractions  Blood type ONeg  -- had Rhig in June MAU Course  Procedures  Assessment and Plan  A:  SIUP at [redacted]w[redacted]d       One episode of blood on tissue, ?source, could be hemorrhoidal P:  Discharge home       Bleeding precautions, call if you see more       Discussed starting nightly fiber laxative  WILLIAMS,MARIE 10/09/2011, 11:19 PM

## 2011-10-09 NOTE — MAU Note (Signed)
Pt says she has been cramping since 1800 tonight and had one episode of bright red bleeding after voiding while at home.  No further c/o bleeding.

## 2011-10-27 ENCOUNTER — Other Ambulatory Visit: Payer: Self-pay | Admitting: Obstetrics

## 2011-10-27 ENCOUNTER — Telehealth (HOSPITAL_COMMUNITY): Payer: Self-pay | Admitting: *Deleted

## 2011-10-27 ENCOUNTER — Encounter (HOSPITAL_COMMUNITY): Payer: Self-pay | Admitting: *Deleted

## 2011-10-27 NOTE — Telephone Encounter (Signed)
Preadmission screen  

## 2011-10-28 ENCOUNTER — Telehealth (HOSPITAL_COMMUNITY): Payer: Self-pay | Admitting: *Deleted

## 2011-10-28 ENCOUNTER — Encounter (HOSPITAL_COMMUNITY): Payer: Self-pay | Admitting: *Deleted

## 2011-10-28 NOTE — Telephone Encounter (Signed)
Preadmission screen  

## 2011-10-29 ENCOUNTER — Inpatient Hospital Stay (HOSPITAL_COMMUNITY)
Admission: AD | Admit: 2011-10-29 | Discharge: 2011-10-29 | Disposition: A | Discharge status: Home / Self Care | Payer: BC Managed Care – PPO | Source: Ambulatory Visit | Attending: Obstetrics | Admitting: Obstetrics

## 2011-10-29 ENCOUNTER — Encounter (HOSPITAL_COMMUNITY): Payer: Self-pay | Admitting: *Deleted

## 2011-10-29 DIAGNOSIS — O479 False labor, unspecified: Secondary | ICD-10-CM | POA: Insufficient documentation

## 2011-10-29 HISTORY — DX: Gestational (pregnancy-induced) hypertension without significant proteinuria, unspecified trimester: O13.9

## 2011-10-29 NOTE — MAU Note (Signed)
Tracing reviewed with pt, she confirms ctx's are not coming as often as they were.

## 2011-10-29 NOTE — MAU Note (Signed)
Contractions since 0600 today every 7 minutes, denies vaginal bleeding, no LOF, positive FM [redacted]w[redacted]d G3P2

## 2011-10-29 NOTE — MAU Note (Signed)
Ctx's since 0600, getting closer. Now q 5

## 2011-11-02 ENCOUNTER — Encounter (HOSPITAL_COMMUNITY): Payer: Self-pay

## 2011-11-02 ENCOUNTER — Inpatient Hospital Stay (HOSPITAL_COMMUNITY)
Admission: RE | Admit: 2011-11-02 | Discharge: 2011-11-05 | DRG: 373 | Disposition: A | Discharge status: Home / Self Care | Payer: BC Managed Care – PPO | Source: Ambulatory Visit | Attending: Obstetrics | Admitting: Obstetrics

## 2011-11-02 ENCOUNTER — Inpatient Hospital Stay (HOSPITAL_COMMUNITY)
Admission: AD | Admit: 2011-11-02 | Discharge: 2011-11-02 | Disposition: A | Discharge status: Home / Self Care | Payer: BC Managed Care – PPO | Source: Ambulatory Visit | Attending: Obstetrics | Admitting: Obstetrics

## 2011-11-02 DIAGNOSIS — O9912 Other diseases of the blood and blood-forming organs and certain disorders involving the immune mechanism complicating childbirth: Principal | ICD-10-CM | POA: Diagnosis present

## 2011-11-02 DIAGNOSIS — F411 Generalized anxiety disorder: Secondary | ICD-10-CM | POA: Diagnosis present

## 2011-11-02 DIAGNOSIS — O479 False labor, unspecified: Secondary | ICD-10-CM | POA: Insufficient documentation

## 2011-11-02 DIAGNOSIS — D689 Coagulation defect, unspecified: Principal | ICD-10-CM | POA: Diagnosis present

## 2011-11-02 DIAGNOSIS — D696 Thrombocytopenia, unspecified: Secondary | ICD-10-CM | POA: Diagnosis present

## 2011-11-02 DIAGNOSIS — O99344 Other mental disorders complicating childbirth: Secondary | ICD-10-CM | POA: Diagnosis present

## 2011-11-02 LAB — CBC
Hemoglobin: 12.1 g/dL (ref 12.0–15.0)
MCH: 33.5 pg (ref 26.0–34.0)
Platelets: 140 10*3/uL — ABNORMAL LOW (ref 150–400)
RBC: 3.61 MIL/uL — ABNORMAL LOW (ref 3.87–5.11)

## 2011-11-02 MED ORDER — NALBUPHINE SYRINGE 5 MG/0.5 ML: 10.0000 mg | INJECTION | INTRAMUSCULAR | Status: DC | PRN

## 2011-11-02 MED ORDER — ACETAMINOPHEN 325 MG PO TABS: 650.0000 mg | ORAL_TABLET | ORAL | Status: DC | PRN

## 2011-11-02 MED ORDER — PROMETHAZINE HCL 25 MG/ML IJ SOLN: 25.0000 mg | Freq: Four times a day (QID) | INTRAMUSCULAR | Status: DC | PRN

## 2011-11-02 MED ORDER — CITRIC ACID-SODIUM CITRATE 334-500 MG/5ML PO SOLN: 30.0000 mL | ORAL | Status: DC | PRN

## 2011-11-02 MED ORDER — BUTORPHANOL TARTRATE 1 MG/ML IJ SOLN
1.0000 mg | INTRAMUSCULAR | Status: DC | PRN
  Administered 2011-11-03: 1 mg via INTRAVENOUS
  Filled 2011-11-02: qty 1

## 2011-11-02 MED ORDER — ONDANSETRON HCL 4 MG/2ML IJ SOLN: 4.0000 mg | Freq: Four times a day (QID) | INTRAMUSCULAR | Status: DC | PRN

## 2011-11-02 MED ORDER — OXYCODONE-ACETAMINOPHEN 5-325 MG PO TABS: 1.0000 | ORAL_TABLET | ORAL | Status: DC | PRN

## 2011-11-02 MED ORDER — OXYTOCIN BOLUS FROM INFUSION
250.0000 mL | Freq: Once | INTRAVENOUS | Status: AC
  Administered 2011-11-03: 250 mL via INTRAVENOUS
  Filled 2011-11-02: qty 500

## 2011-11-02 MED ORDER — LACTATED RINGERS IV SOLN: 500.0000 mL | INTRAVENOUS | Status: DC | PRN

## 2011-11-02 MED ORDER — LIDOCAINE HCL (PF) 1 % IJ SOLN: 30.0000 mL | INTRAMUSCULAR | Status: DC | PRN

## 2011-11-02 MED ORDER — OXYCODONE-ACETAMINOPHEN 5-325 MG PO TABS: 2.0000 | ORAL_TABLET | ORAL | Status: AC | PRN

## 2011-11-02 MED ORDER — IBUPROFEN 600 MG PO TABS: 600.0000 mg | ORAL_TABLET | Freq: Four times a day (QID) | ORAL | Status: DC | PRN

## 2011-11-02 MED ORDER — TERBUTALINE SULFATE 1 MG/ML IJ SOLN: 0.2500 mg | Freq: Once | INTRAMUSCULAR | Status: AC | PRN

## 2011-11-02 MED ORDER — OXYTOCIN 40 UNITS IN LACTATED RINGERS INFUSION - SIMPLE MED
62.5000 mL/h | Freq: Once | INTRAVENOUS | Status: AC
  Administered 2011-11-03: 62.5 mL/h via INTRAVENOUS

## 2011-11-02 MED ORDER — OXYCODONE-ACETAMINOPHEN 5-325 MG PO TABS
2.0000 | ORAL_TABLET | Freq: Once | ORAL | Status: AC
  Administered 2011-11-02: 2 via ORAL
  Filled 2011-11-02: qty 2

## 2011-11-02 MED ORDER — HYDROXYZINE HCL 50 MG/ML IM SOLN: 50.0000 mg | Freq: Four times a day (QID) | INTRAMUSCULAR | Status: DC | PRN

## 2011-11-02 MED ORDER — NALBUPHINE SYRINGE 5 MG/0.5 ML: 10.0000 mg | INJECTION | Freq: Four times a day (QID) | INTRAMUSCULAR | Status: DC | PRN

## 2011-11-02 MED ORDER — LACTATED RINGERS IV SOLN
INTRAVENOUS | Status: DC
  Administered 2011-11-02: 21:00:00 via INTRAVENOUS
  Administered 2011-11-03: 125 mL/h via INTRAVENOUS

## 2011-11-02 MED ORDER — MISOPROSTOL 25 MCG QUARTER TABLET
25.0000 ug | ORAL_TABLET | ORAL | Status: DC | PRN
  Administered 2011-11-02 – 2011-11-03 (×2): 25 ug via VAGINAL
  Filled 2011-11-02 (×2): qty 0.25

## 2011-11-02 MED ORDER — HYDROXYZINE HCL 50 MG PO TABS: 50.0000 mg | ORAL_TABLET | Freq: Four times a day (QID) | ORAL | Status: DC | PRN

## 2011-11-02 NOTE — MAU Note (Signed)
Pt stats has had back pain since 0100 this am, denies bleeding or lof. Feels tightening every few minutes. Pt called Dr, and was told to come to MAU for eval.

## 2011-11-02 NOTE — Progress Notes (Signed)
LILLY GASSER is a 26 y.o. G3P2002 at [redacted]w[redacted]d by LMP admitted for induction of labor due to Elective at term.  Subjective:   Objective: BP 111/73  Pulse 101  Temp 98.1 F (36.7 C) (Oral)      FHT:  FHR: 150 bpm, variability: moderate,  accelerations:  Present,  decelerations:  Absent UC:   Irregular, mild. SVE:   Dilation: 2 Effacement (%): 50 Station: -3 Exam by:: a. white rn  Labs: Lab Results  Component Value Date   WBC 13.5* 11/02/2011   HGB 12.1 11/02/2011   HCT 34.8* 11/02/2011   MCV 96.4 11/02/2011   PLT 140* 11/02/2011    Assessment / Plan: 39 weeks.  Elective IOL.  Labor: Latent phase. Preeclampsia:  n/a Fetal Wellbeing:  Category I Pain Control:  Labor support without medications I/D:  n/a Anticipated MOD:  NSVD  Jatniel Verastegui A 11/02/2011, 10:18 PM

## 2011-11-02 NOTE — H&P (Signed)
Lauren Garcia is a 26 y.o. female presenting for IOL. Maternal Medical History:  Reason for admission: 26 yo G3 P2  EDC 11-06-11.  Presents for elective IOL at term.  Fetal activity: Perceived fetal activity is normal.   Last perceived fetal movement was within the past hour.    Prenatal complications: no prenatal complications Prenatal Complications - Diabetes: none.    OB History    Grav Para Term Preterm Abortions TAB SAB Ect Mult Living   3 2 2  0 0 0 0 0 0 2     Past Medical History  Diagnosis Date  . Sinus infection   . Asthma     "sinus induced"  . Anxiety   . Depression   . Thrombocytopenia complicating pregnancy   . Pregnancy induced hypertension     with first preg   Past Surgical History  Procedure Date  . Sinus exploration   . Wisdom tooth extraction    Family History: family history includes Arthritis in her maternal grandmother, mother, and paternal grandmother; COPD in her maternal grandmother; Cancer in her maternal grandmother; Diabetes in her maternal grandmother and paternal grandmother; and Hypertension in her maternal grandmother and paternal grandmother.  There is no history of Anesthesia problems and Other. Social History:  reports that she has never smoked. She has never used smokeless tobacco. She reports that she does not drink alcohol or use illicit drugs.   Prenatal Transfer Tool  Maternal Diabetes: No Genetic Screening: Normal Maternal Ultrasounds/Referrals: Normal Fetal Ultrasounds or other Referrals:  None Maternal Substance Abuse:  No Significant Maternal Medications:  Meds include: Other: see prenatal record Significant Maternal Lab Results:  Lab values include: Other: see prenatal record Other Comments:  None  Review of Systems  All other systems reviewed and are negative.    Dilation: 2 Effacement (%): 50 Station: -3 Exam by:: a. white rn Blood pressure 111/73, pulse 101, temperature 98.1 F (36.7 C), temperature  source Oral. Maternal Exam:  Abdomen: Patient reports no abdominal tenderness. Fetal presentation: vertex  Introitus: Normal vulva. Normal vagina.    Physical Exam  Nursing note and vitals reviewed. Constitutional: She is oriented to person, place, and time. She appears well-developed and well-nourished.  HENT:  Head: Normocephalic and atraumatic.  Eyes: Conjunctivae are normal. Pupils are equal, round, and reactive to light.  Neck: Normal range of motion. Neck supple.  Cardiovascular: Normal rate and regular rhythm.   Respiratory: Effort normal and breath sounds normal.  GI: Soft.  Musculoskeletal: Normal range of motion.  Neurological: She is alert and oriented to person, place, and time.  Skin: Skin is warm and dry.  Psychiatric: She has a normal mood and affect. Her behavior is normal. Judgment and thought content normal.    Prenatal labs: ABO, Rh: --/--/O NEG (06/14 1021) Antibody: NEG (06/14 1021) Rubella: Immune (02/19 0000) RPR: Nonreactive (02/19 0000)  HBsAg: Negative (02/19 0000)  HIV: Non-reactive (02/19 0000)  GBS: Negative (07/10 0000)   Assessment/Plan: 39 weeks.  Elective IOL.  2 stage.   HARPER,CHARLES A 11/02/2011, 10:11 PM

## 2011-11-03 ENCOUNTER — Encounter (HOSPITAL_COMMUNITY): Payer: Self-pay | Admitting: Anesthesiology

## 2011-11-03 ENCOUNTER — Inpatient Hospital Stay (HOSPITAL_COMMUNITY): Payer: BC Managed Care – PPO | Admitting: Anesthesiology

## 2011-11-03 ENCOUNTER — Inpatient Hospital Stay (HOSPITAL_COMMUNITY): Admission: RE | Admit: 2011-11-03 | Payer: BC Managed Care – PPO | Source: Ambulatory Visit

## 2011-11-03 ENCOUNTER — Inpatient Hospital Stay (HOSPITAL_COMMUNITY): Payer: BC Managed Care – PPO

## 2011-11-03 ENCOUNTER — Encounter (HOSPITAL_COMMUNITY): Payer: Self-pay

## 2011-11-03 DIAGNOSIS — O1490 Unspecified pre-eclampsia, unspecified trimester: Secondary | ICD-10-CM

## 2011-11-03 LAB — CBC
HCT: 34.1 % — ABNORMAL LOW (ref 36.0–46.0)
Hemoglobin: 11.7 g/dL — ABNORMAL LOW (ref 12.0–15.0)
MCV: 97.2 fL (ref 78.0–100.0)
MCV: 97.7 fL (ref 78.0–100.0)
Platelets: 116 10*3/uL — ABNORMAL LOW (ref 150–400)
RDW: 12.9 % (ref 11.5–15.5)
WBC: 11.6 10*3/uL — ABNORMAL HIGH (ref 4.0–10.5)
WBC: 13.3 10*3/uL — ABNORMAL HIGH (ref 4.0–10.5)

## 2011-11-03 LAB — PREPARE RBC (CROSSMATCH)

## 2011-11-03 LAB — RPR: RPR Ser Ql: NONREACTIVE

## 2011-11-03 MED ORDER — MEDROXYPROGESTERONE ACETATE 150 MG/ML IM SUSP: 150.0000 mg | INTRAMUSCULAR | Status: DC | PRN

## 2011-11-03 MED ORDER — EPHEDRINE 5 MG/ML INJ: 10.0000 mg | INTRAVENOUS | Status: DC | PRN

## 2011-11-03 MED ORDER — LIDOCAINE HCL (PF) 1 % IJ SOLN
INTRAMUSCULAR | Status: DC | PRN
  Administered 2011-11-03: 7 mL
  Administered 2011-11-03: 9 mL

## 2011-11-03 MED ORDER — SENNOSIDES-DOCUSATE SODIUM 8.6-50 MG PO TABS
2.0000 | ORAL_TABLET | Freq: Every day | ORAL | Status: DC
  Administered 2011-11-03 – 2011-11-04 (×2): 2 via ORAL

## 2011-11-03 MED ORDER — OXYCODONE-ACETAMINOPHEN 5-325 MG PO TABS
1.0000 | ORAL_TABLET | ORAL | Status: DC | PRN
  Administered 2011-11-03 – 2011-11-04 (×4): 1 via ORAL
  Filled 2011-11-03 (×4): qty 1

## 2011-11-03 MED ORDER — IBUPROFEN 600 MG PO TABS
600.0000 mg | ORAL_TABLET | Freq: Four times a day (QID) | ORAL | Status: DC
  Administered 2011-11-03 – 2011-11-05 (×7): 600 mg via ORAL
  Filled 2011-11-03 (×7): qty 1

## 2011-11-03 MED ORDER — PHENYLEPHRINE 40 MCG/ML (10ML) SYRINGE FOR IV PUSH (FOR BLOOD PRESSURE SUPPORT): 80.0000 ug | PREFILLED_SYRINGE | INTRAVENOUS | Status: DC | PRN

## 2011-11-03 MED ORDER — FENTANYL 2.5 MCG/ML BUPIVACAINE 1/10 % EPIDURAL INFUSION (WH - ANES)
14.0000 mL/h | INTRAMUSCULAR | Status: DC
  Filled 2011-11-03: qty 60

## 2011-11-03 MED ORDER — OXYTOCIN 40 UNITS IN LACTATED RINGERS INFUSION - SIMPLE MED: 62.5000 mL/h | INTRAVENOUS | Status: DC | PRN

## 2011-11-03 MED ORDER — ONDANSETRON HCL 4 MG PO TABS: 4.0000 mg | ORAL_TABLET | ORAL | Status: DC | PRN

## 2011-11-03 MED ORDER — LANOLIN HYDROUS EX OINT: TOPICAL_OINTMENT | CUTANEOUS | Status: DC | PRN

## 2011-11-03 MED ORDER — BENZOCAINE-MENTHOL 20-0.5 % EX AERO
1.0000 "application " | INHALATION_SPRAY | CUTANEOUS | Status: DC | PRN
  Filled 2011-11-03 (×2): qty 56

## 2011-11-03 MED ORDER — PHENYLEPHRINE 40 MCG/ML (10ML) SYRINGE FOR IV PUSH (FOR BLOOD PRESSURE SUPPORT)
80.0000 ug | PREFILLED_SYRINGE | INTRAVENOUS | Status: DC | PRN
  Filled 2011-11-03: qty 5

## 2011-11-03 MED ORDER — ZOLPIDEM TARTRATE 5 MG PO TABS: 5.0000 mg | ORAL_TABLET | Freq: Every evening | ORAL | Status: DC | PRN

## 2011-11-03 MED ORDER — WITCH HAZEL-GLYCERIN EX PADS
1.0000 "application " | MEDICATED_PAD | CUTANEOUS | Status: DC | PRN
  Administered 2011-11-03: 1 via TOPICAL

## 2011-11-03 MED ORDER — EPHEDRINE 5 MG/ML INJ
10.0000 mg | INTRAVENOUS | Status: DC | PRN
  Filled 2011-11-03: qty 4

## 2011-11-03 MED ORDER — DIBUCAINE 1 % RE OINT
1.0000 "application " | TOPICAL_OINTMENT | RECTAL | Status: DC | PRN
  Filled 2011-11-03: qty 28

## 2011-11-03 MED ORDER — FENTANYL 2.5 MCG/ML BUPIVACAINE 1/10 % EPIDURAL INFUSION (WH - ANES)
INTRAMUSCULAR | Status: DC | PRN
  Administered 2011-11-03: 15 mL/h via EPIDURAL

## 2011-11-03 MED ORDER — LACTATED RINGERS IV SOLN
500.0000 mL | Freq: Once | INTRAVENOUS | Status: AC
  Administered 2011-11-03: 1000 mL via INTRAVENOUS

## 2011-11-03 MED ORDER — TETANUS-DIPHTH-ACELL PERTUSSIS 5-2.5-18.5 LF-MCG/0.5 IM SUSP
0.5000 mL | Freq: Once | INTRAMUSCULAR | Status: AC
  Administered 2011-11-04: 0.5 mL via INTRAMUSCULAR
  Filled 2011-11-03 (×2): qty 0.5

## 2011-11-03 MED ORDER — DIPHENHYDRAMINE HCL 25 MG PO CAPS: 25.0000 mg | ORAL_CAPSULE | Freq: Four times a day (QID) | ORAL | Status: DC | PRN

## 2011-11-03 MED ORDER — DIPHENHYDRAMINE HCL 50 MG/ML IJ SOLN: 12.5000 mg | INTRAMUSCULAR | Status: DC | PRN

## 2011-11-03 MED ORDER — OXYTOCIN 40 UNITS IN LACTATED RINGERS INFUSION - SIMPLE MED
1.0000 m[IU]/min | INTRAVENOUS | Status: DC
  Administered 2011-11-03: 2 m[IU]/min via INTRAVENOUS
  Filled 2011-11-03: qty 1000

## 2011-11-03 MED ORDER — PRENATAL MULTIVITAMIN CH
1.0000 | ORAL_TABLET | Freq: Every day | ORAL | Status: DC
  Administered 2011-11-04 – 2011-11-05 (×2): 1 via ORAL
  Filled 2011-11-03 (×2): qty 1

## 2011-11-03 MED ORDER — ONDANSETRON HCL 4 MG/2ML IJ SOLN: 4.0000 mg | INTRAMUSCULAR | Status: DC | PRN

## 2011-11-03 MED ORDER — SIMETHICONE 80 MG PO CHEW
80.0000 mg | CHEWABLE_TABLET | ORAL | Status: DC | PRN
  Administered 2011-11-04: 80 mg via ORAL

## 2011-11-03 NOTE — Anesthesia Procedure Notes (Signed)
Epidural Patient location during procedure: OB Start time: 11/03/2011 10:34 AM End time: 11/03/2011 10:39 AM Reason for block: procedure for pain  Staffing Anesthesiologist: Sandrea Hughs Performed by: anesthesiologist   Preanesthetic Checklist Completed: patient identified, site marked, surgical consent, pre-op evaluation, timeout performed, IV checked, risks and benefits discussed and monitors and equipment checked  Epidural Patient position: sitting Prep: site prepped and draped and DuraPrep Patient monitoring: continuous pulse ox and blood pressure Approach: midline Injection technique: LOR air  Needle:  Needle type: Tuohy  Needle gauge: 17 G Needle length: 9 cm Needle insertion depth: 5 cm cm Catheter type: closed end flexible Catheter size: 19 Gauge Catheter at skin depth: 10 cm Test dose: negative and Other  Assessment Sensory level: T8 Events: blood not aspirated, injection not painful, no injection resistance, negative IV test and no paresthesia

## 2011-11-03 NOTE — Anesthesia Preprocedure Evaluation (Signed)
Anesthesia Evaluation  Patient identified by MRN, date of birth, ID band Patient awake    Reviewed: Allergy & Precautions, H&P , NPO status , Patient's Chart, lab work & pertinent test results  Airway Mallampati: II TM Distance: >3 FB Neck ROM: full    Dental No notable dental hx.    Pulmonary    Pulmonary exam normal       Cardiovascular     Neuro/Psych PSYCHIATRIC DISORDERS Anxiety Depression negative neurological ROS     GI/Hepatic negative GI ROS, Neg liver ROS,   Endo/Other  Morbid obesity  Renal/GU negative Renal ROS  negative genitourinary   Musculoskeletal negative musculoskeletal ROS (+)   Abdominal (+) + obese,   Peds negative pediatric ROS (+)  Hematology negative hematology ROS (+)   Anesthesia Other Findings   Reproductive/Obstetrics (+) Pregnancy                           Anesthesia Physical Anesthesia Plan  ASA: III  Anesthesia Plan: Epidural   Post-op Pain Management:    Induction:   Airway Management Planned:   Additional Equipment:   Intra-op Plan:   Post-operative Plan:   Informed Consent: I have reviewed the patients History and Physical, chart, labs and discussed the procedure including the risks, benefits and alternatives for the proposed anesthesia with the patient or authorized representative who has indicated his/her understanding and acceptance.     Plan Discussed with:   Anesthesia Plan Comments:         Anesthesia Quick Evaluation

## 2011-11-03 NOTE — Anesthesia Postprocedure Evaluation (Signed)
  Anesthesia Post-op Note  Patient: Lauren Garcia  Procedure(s) Performed: * No procedures listed *  Patient Location: Mother/Baby  Anesthesia Type: Epidural  Level of Consciousness: awake, alert  and oriented  Airway and Oxygen Therapy: Patient Spontanous Breathing  Post-op Pain: mild  Post-op Assessment: Post-op Vital signs reviewed, Patient's Cardiovascular Status Stable, No headache, No backache, No residual numbness and No residual motor weakness  Post-op Vital Signs: Reviewed and stable  Complications: No apparent anesthesia complications

## 2011-11-04 LAB — CBC
MCH: 33.4 pg (ref 26.0–34.0)
Platelets: 106 10*3/uL — ABNORMAL LOW (ref 150–400)
RBC: 3.14 MIL/uL — ABNORMAL LOW (ref 3.87–5.11)
RDW: 13 % (ref 11.5–15.5)
WBC: 10.3 10*3/uL (ref 4.0–10.5)

## 2011-11-04 MED ORDER — RHO D IMMUNE GLOBULIN 1500 UNIT/2ML IJ SOLN
300.0000 ug | Freq: Once | INTRAMUSCULAR | Status: AC
  Administered 2011-11-04: 300 ug via INTRAVENOUS
  Filled 2011-11-04: qty 2

## 2011-11-04 NOTE — Progress Notes (Signed)
Post Partum Day 1 Subjective: no complaints  Objective: Blood pressure 99/65, pulse 88, temperature 97.7 F (36.5 C), temperature source Oral, resp. rate 18, height 5\' 6"  (1.676 m), weight 103.874 kg (229 lb), SpO2 100.00%, unknown if currently breastfeeding.  Physical Exam:  General: alert and no distress Lochia: appropriate Uterine Fundus: firm Incision: none DVT Evaluation: No evidence of DVT seen on physical exam.   Basename 11/04/11 0515 11/03/11 1200  HGB 10.5* 11.5*  HCT 30.6* 34.2*    Assessment/Plan: Plan for discharge tomorrow   LOS: 2 days   Sri Clegg A 11/04/2011, 9:58 AM

## 2011-11-04 NOTE — Progress Notes (Signed)
Patient was unable to receive her Rhophylac IV as the IV was occluded by the time the medication was picked up from the lab.  The medication was ordered 11/03/11 and was ready in lab by 2030 on 11/03/11, however was not picked up until today at 1800 by this RN Clydie Braun H).  At this time the IV had occluded as it was 25 days old.

## 2011-11-04 NOTE — Progress Notes (Signed)

## 2011-11-04 NOTE — Progress Notes (Signed)
UR chart review completed.  

## 2011-11-05 LAB — RH IG WORKUP (INCLUDES ABO/RH)
Fetal Screen: NEGATIVE
Gestational Age(Wks): 39.4

## 2011-11-05 NOTE — Discharge Summary (Signed)
Obstetric Discharge Summary Reason for Admission: onset of labor Prenatal Procedures: none Intrapartum Procedures: spontaneous vaginal delivery Postpartum Procedures: none Complications-Operative and Postpartum: none Hemoglobin  Date Value Range Status  11/04/2011 10.5* 12.0 - 15.0 g/dL Final     HCT  Date Value Range Status  11/04/2011 30.6* 36.0 - 46.0 % Final    Physical Exam:  General: alert Lochia: appropriate Uterine Fundus: firm Incision: healing well DVT Evaluation: No evidence of DVT seen on physical exam.  Discharge Diagnoses: Term Pregnancy-delivered  Discharge Information: Date: 11/05/2011 Activity: pelvic rest Diet: routine Medications: Percocet Condition: stable Instructions: refer to practice specific booklet Discharge to: home Follow-up Information    Follow up with HARPER,CHARLES A, MD. Call in 6 weeks.   Contact information:   [address] [place] [address] [phone]          Newborn Data: Live born female  Birth Weight: 6 lb 15.5 oz (3160 g) APGAR: 9, 9  Home with mother.  MARSHALL,BERNARD A 11/05/2011, 6:51 AM

## 2011-11-06 LAB — TYPE AND SCREEN: Antibody Screen: POSITIVE

## 2013-03-20 IMAGING — US US OB COMP +14 WK
1 series · 12 of 28 positions shown · non-contrast
Comparison: none

[Series 1: us ob comp +14 wk · 12 of 53 slices shown]
[im 2/53]
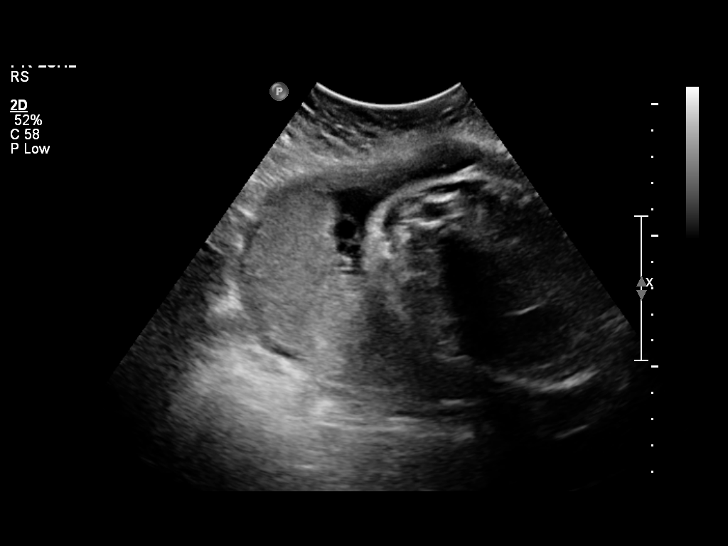
[im 6/53]
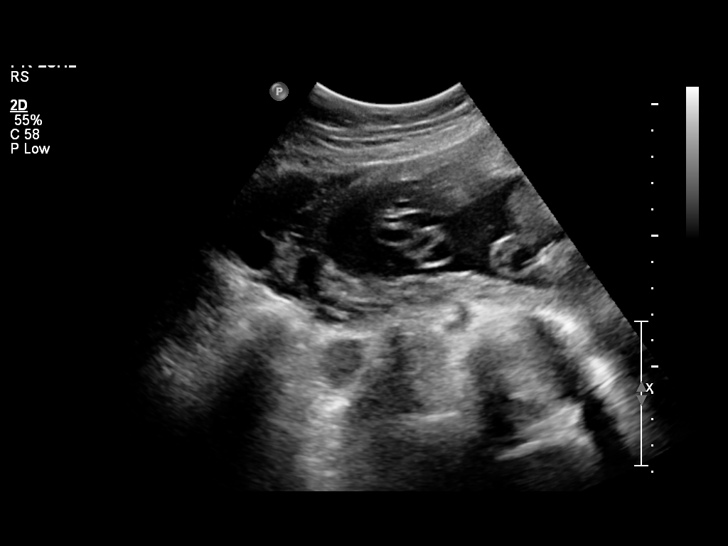
[im 10/53]
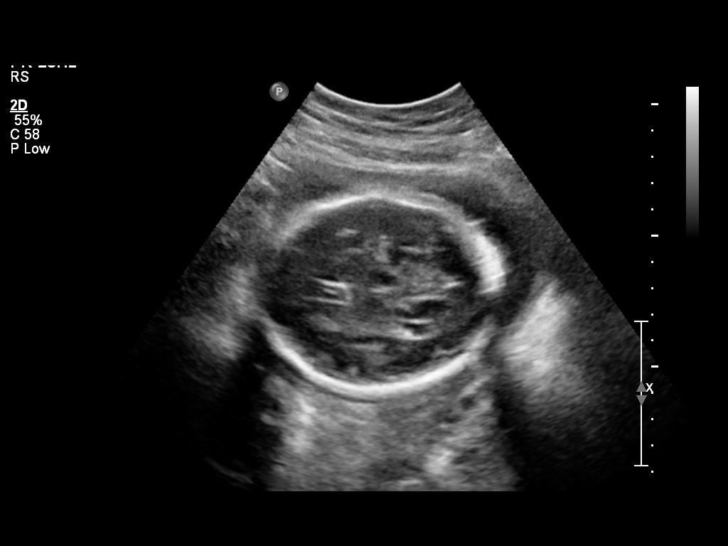
[im 16/53]
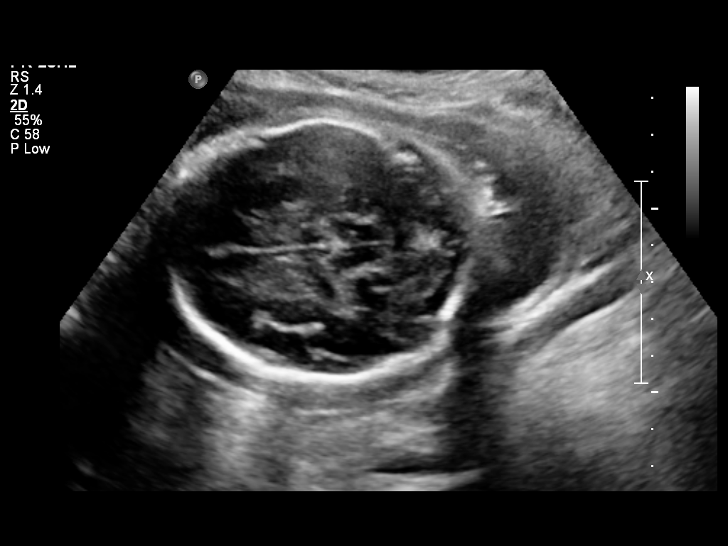
[im 20/53]
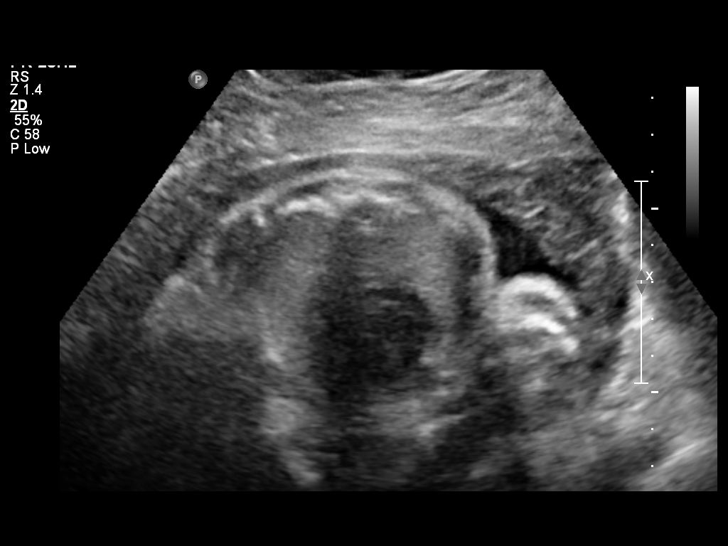
[im 24/53]
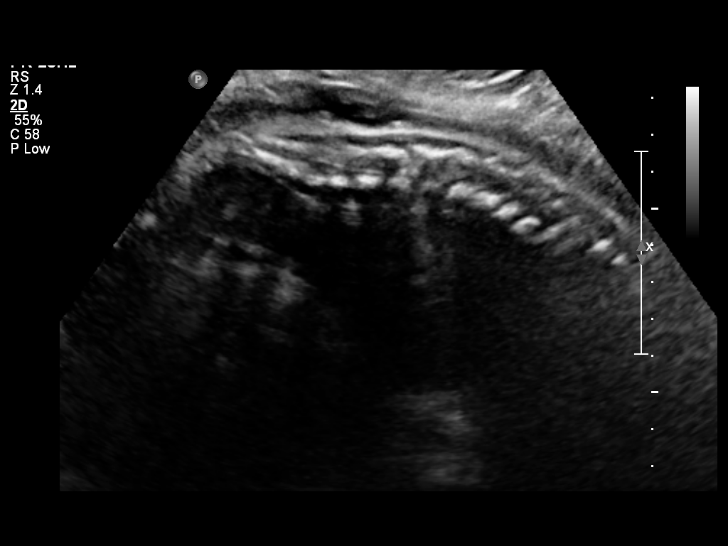
[im 29/53]
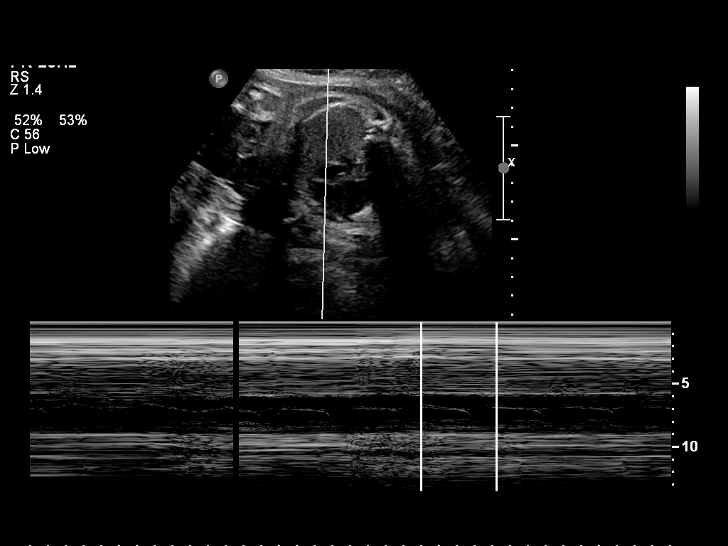
[im 33/53]
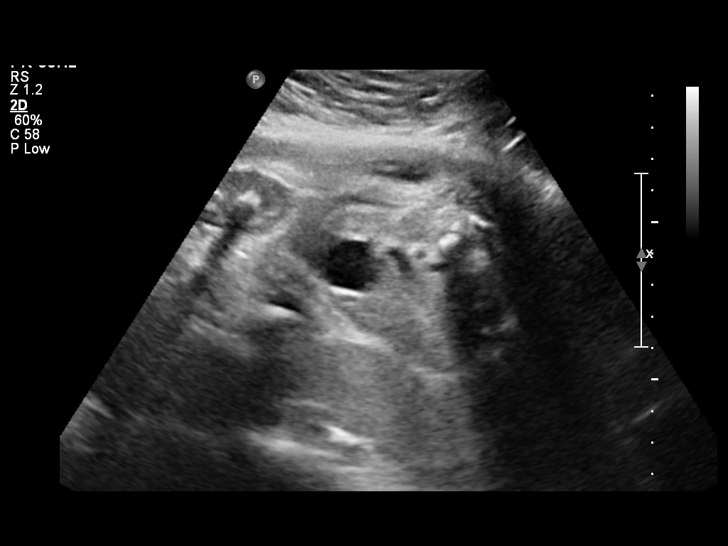
[im 37/53]
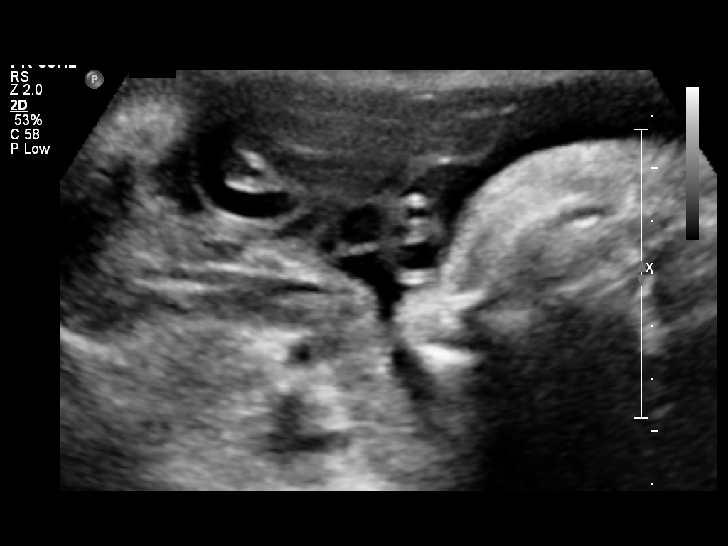
[im 43/53]
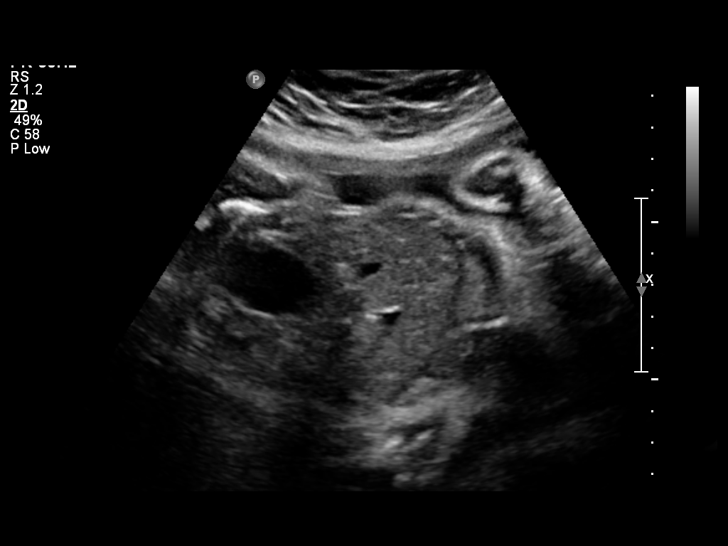
[im 47/53]
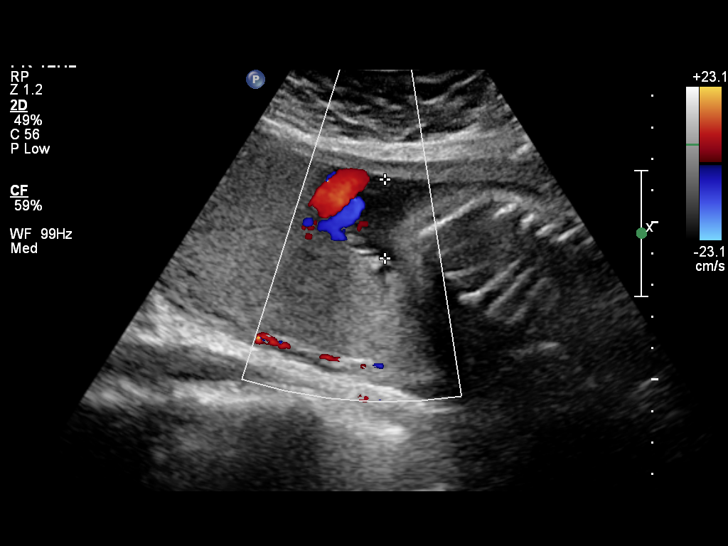
[im 51/53]
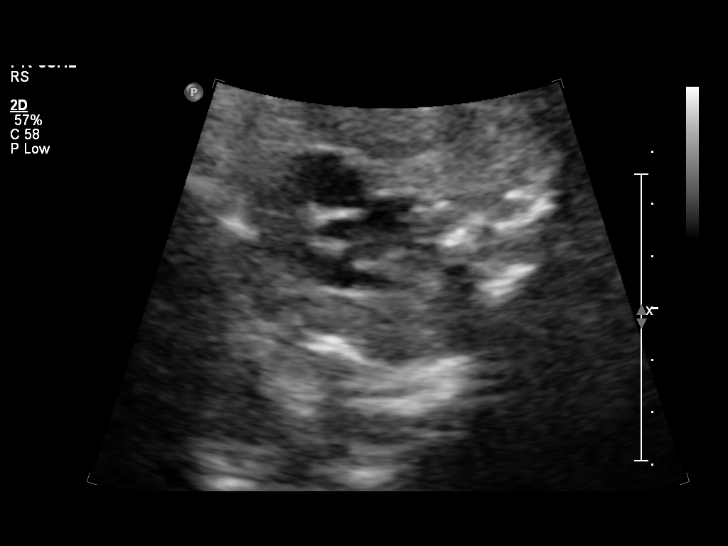

[12 of 28 positions shown; findings below may reference images not displayed]

ID:                                         D.O.B.:

 Order#:         [id number]
Procedures

 US OB COMP + 14 WK                                    76805.1
Indications

 Assess Fetal Growth / Estimated Fetal Weight
 Assess amniotic fluid volume
Fetal Evaluation

 Fetal Heart Rate:  142                         bpm
 Cardiac Activity:  Observed
 Presentation:      Cephalic
 Placenta:          Posterior, above cervical
                    os

 Amniotic Fluid
 AFI FV:      Subjectively within normal limits
 AFI Sum:     12.56   cm      34   %Tile     Larg Pckt:   5.13   cm
 RUQ:   3.37   cm    RLQ:    2.5    cm    LUQ:   1.56    cm   LLQ:    5.13   cm
Biometry

 BPD:     71.2  mm    G. Age:   28w 4d                CI:        75.27   70 - 86
                                                      FL/HC:      22.1   19.2 -

 HC:     260.3  mm    G. Age:   28w 2d      < 3  %    HC/AC:      1.04   0.99 -

 AC:     250.5  mm    G. Age:   29w 2d       21  %    FL/BPD:     80.9   71 - 87
 FL:      57.6  mm    G. Age:   30w 1d       36  %    FL/AC:      23.0   20 - 24
 Est. FW:    2699  gm      3 lb 1 oz     38  %
Gestational Age

 LMP:           30w 1d       Date:   01/30/11                 EDD:   11/06/11
 U/S Today:     29w 1d                                        EDD:   11/13/11
 Best:          30w 1d    Det. By:   LMP  (01/30/11)          EDD:   11/06/11
Anatomy

 Cranium:           Appears normal      Aortic Arch:       Basic anatomy
                                                           exam per order
 Fetal Cavum:       Appears normal      Ductal Arch:       Basic anatomy
                                                           exam per order
 Ventricles:        Appears normal      Diaphragm:         Appears normal
 Choroid Plexus:    Appears normal      Stomach:           Appears
                                                           normal, left
                                                           sided
 Cerebellum:        Appears normal      Abdomen:           Appears normal
 Posterior Fossa:   Not well            Abdominal Wall:    Not well
                    visualized                             visualized
 Nuchal Fold:       Not applicable      Cord Vessels:      Appears normal
                    (>20 wks GA)                           (3 vessel cord)
 Face:              Lips appear         Kidneys:           Appear normal
                    normal (basic
                    anatomy exam)
 Heart:             Appears normal      Bladder:           Appears normal
                    (4 chamber &
                    axis)
 RVOT:              Appears normal      Spine:             Appears normal
 LVOT:              Not well            Limbs:             Not well
                    visualized                             visualized

 Other:     Technically difficult due to advanced GA and fetal
            position.
Cervix Uterus Adnexa

 Cervical Length:   3.1       cm

 Cervix:       Normal appearance by translabial scan.

 Left Ovary:   Not visualized.
 Right Ovary:  Not visualized.
 Adnexa:     No abnormality visualized.
Impression

 Single living intrauterine pregnancy in cephalic presentation.
 The estimated gestational age is 30w 1d based on LMP
 (01/30/11). No fetal anomalies are identified. Anatomic
 survey is limited by late gestational age and fetal lie.

 concerns.

## 2014-01-27 ENCOUNTER — Encounter (HOSPITAL_COMMUNITY): Payer: Self-pay

## 2014-11-13 ENCOUNTER — Ambulatory Visit: Payer: Self-pay | Admitting: Obstetrics

## 2014-12-22 ENCOUNTER — Ambulatory Visit: Payer: Self-pay | Admitting: Obstetrics

## 2015-01-19 ENCOUNTER — Ambulatory Visit: Payer: Self-pay | Admitting: Obstetrics

## 2015-07-30 ENCOUNTER — Ambulatory Visit (INDEPENDENT_AMBULATORY_CARE_PROVIDER_SITE_OTHER): Payer: BLUE CROSS/BLUE SHIELD | Admitting: Obstetrics

## 2015-07-30 ENCOUNTER — Encounter: Payer: Self-pay | Admitting: Obstetrics

## 2015-07-30 ENCOUNTER — Other Ambulatory Visit: Payer: Self-pay | Admitting: Obstetrics

## 2015-07-30 VITALS — BP 133/90 | HR 88 | Wt 254.0 lb

## 2015-07-30 DIAGNOSIS — Z1389 Encounter for screening for other disorder: Secondary | ICD-10-CM | POA: Diagnosis not present

## 2015-07-30 DIAGNOSIS — Z30011 Encounter for initial prescription of contraceptive pills: Secondary | ICD-10-CM

## 2015-07-30 DIAGNOSIS — Z01419 Encounter for gynecological examination (general) (routine) without abnormal findings: Secondary | ICD-10-CM | POA: Diagnosis not present

## 2015-07-30 DIAGNOSIS — Z3202 Encounter for pregnancy test, result negative: Secondary | ICD-10-CM

## 2015-07-30 DIAGNOSIS — E669 Obesity, unspecified: Secondary | ICD-10-CM

## 2015-07-30 DIAGNOSIS — E282 Polycystic ovarian syndrome: Secondary | ICD-10-CM

## 2015-07-30 DIAGNOSIS — N926 Irregular menstruation, unspecified: Secondary | ICD-10-CM

## 2015-07-30 DIAGNOSIS — N939 Abnormal uterine and vaginal bleeding, unspecified: Secondary | ICD-10-CM

## 2015-07-30 LAB — POCT URINALYSIS DIPSTICK
Bilirubin, UA: NEGATIVE
Glucose, UA: NEGATIVE
Ketones, UA: NEGATIVE
LEUKOCYTES UA: NEGATIVE
NITRITE UA: NEGATIVE
PH UA: 5
PROTEIN UA: NEGATIVE
Spec Grav, UA: 1.01
UROBILINOGEN UA: NEGATIVE

## 2015-07-30 LAB — POCT URINE PREGNANCY: PREG TEST UR: NEGATIVE

## 2015-07-30 MED ORDER — NORETHINDRONE-ETH ESTRADIOL 1-35 MG-MCG PO TABS: 1.0000 | ORAL_TABLET | Freq: Every day | ORAL | Status: DC

## 2015-07-30 NOTE — Progress Notes (Signed)
Subjective:        Lauren Garcia is a 30 y.o. female here for a routine exam.  Current complaints: Irregular cycles.    Personal health questionnaire:  Is patient Ashkenazi Jewish, have a family history of breast and/or ovarian cancer: no Is there a family history of uterine cancer diagnosed at age < 89, gastrointestinal cancer, urinary tract cancer, family member who is a Personnel officer syndrome-associated carrier: no Is the patient overweight and hypertensive, family history of diabetes, personal history of gestational diabetes, preeclampsia or PCOS: no Is patient over 34, have PCOS,  family history of premature CHD under age 64, diabetes, smoke, have hypertension or peripheral artery disease:  no At any time, has a partner hit, kicked or otherwise hurt or frightened you?: no Over the past 2 weeks, have you felt down, depressed or hopeless?: no Over the past 2 weeks, have you felt little interest or pleasure in doing things?:no   Gynecologic History No LMP recorded. Contraception: none Last Pap: unknown. Results were: normal Last mammogram: n/a. Results were: n/a  Obstetric History OB History  Gravida Para Term Preterm AB SAB TAB Ectopic Multiple Living  0 0 0 0 0 0 3    # Outcome Date GA Lbr Len/2nd Weight Sex Delivery Anes PTL Lv  3 Term 11/03/11 [redacted]w[redacted]d 34:06 / 00:21 6 lb 15.5 oz (3.16 kg) F Vag-Spont Other,EPI  Y  2 Term 2010 [redacted]w[redacted]d 01:00 7 lb 5 oz (3.317 kg) M Vag-Spont   Y  1 Term 2007 [redacted]w[redacted]d 17:00 8 lb (3.629 kg) M Vag-Spont EPI  Y      Past Medical History  Diagnosis Date  . Sinus infection   . Asthma     "sinus induced"  . Anxiety   . Depression   . Thrombocytopenia complicating pregnancy (HCC)   . Pregnancy induced hypertension     with first preg    Past Surgical History  Procedure Laterality Date  . Sinus exploration    . Wisdom tooth extraction       Current outpatient prescriptions:  .  Cetirizine HCl (ZYRTEC ALLERGY PO), Take by mouth  daily., Disp: , Rfl:  .  DiphenhydrAMINE HCl (BENADRYL ALLERGY PO), Take by mouth daily., Disp: , Rfl:  .  Montelukast Sodium (SINGULAIR PO), Take by mouth daily., Disp: , Rfl:  .  Multiple Vitamins-Minerals (MULTIVITAMIN ADULTS PO), Take by mouth daily., Disp: , Rfl:  .  norethindrone-ethinyl estradiol 1/35 (ORTHO-NOVUM 1/35, 28,) tablet, Take 1 tablet by mouth daily., Disp: 1 Package, Rfl: 11 No Known Allergies  Social History  Substance Use Topics  . Smoking status: Never Smoker   . Smokeless tobacco: Never Used  . Alcohol Use: No    Family History  Problem Relation Age of Onset  . Anesthesia problems Neg Hx   . Other Neg Hx   . Arthritis Mother   . Diabetes Maternal Grandmother   . Hypertension Maternal Grandmother   . Cancer Maternal Grandmother   . COPD Maternal Grandmother   . Arthritis Maternal Grandmother   . Diabetes Paternal Grandmother   . Hypertension Paternal Grandmother   . Arthritis Paternal Grandmother       Review of Systems  Constitutional: negative for fatigue and weight loss Respiratory: negative for cough and wheezing Cardiovascular: negative for chest pain, fatigue and palpitations Gastrointestinal: negative for abdominal pain and change in bowel habits Musculoskeletal:negative for myalgias Neurological: negative for gait problems and tremors Behavioral/Psych: negative for abusive relationship, depression  Endocrine: negative for temperature intolerance   Genitourinary:negative for abnormal menstrual periods, genital lesions, hot flashes, sexual problems and vaginal discharge Integument/breast: negative for breast lump, breast tenderness, nipple discharge and skin lesion(s)    Objective:       BP 133/90 mmHg  Pulse 88  Wt 254 lb (115.214 kg) General:   alert  Skin:   no rash or abnormalities  Lungs:   clear to auscultation bilaterally  Heart:   regular rate and rhythm, S1, S2 normal, no murmur, click, rub or gallop  Breasts:   normal without  suspicious masses, skin or nipple changes or axillary nodes  Abdomen:  normal findings: no organomegaly, soft, non-tender and no hernia  Pelvis:  External genitalia: normal general appearance Urinary system: urethral meatus normal and bladder without fullness, nontender Vaginal: normal without tenderness, induration or masses Cervix: normal appearance Adnexa: normal bimanual exam Uterus: anteverted and non-tender, normal size   Lab Review Urine pregnancy test Labs reviewed yes Radiologic studies reviewed no    Assessment:    Healthy female exam.     Obesity  Irregular menstrual cycles.  Probable PCOS.   Plan:   Needs to be seen by PCP for general medical health maintenance.    Education reviewed: low fat, low cholesterol diet, safe sex/STD prevention, self breast exams and weight bearing exercise. Contraception: OCP (estrogen/progesterone). Follow up in: 4 months.   Meds ordered this encounter  Medications  . Montelukast Sodium (SINGULAIR PO)    Sig: Take by mouth daily.  . Cetirizine HCl (ZYRTEC ALLERGY PO)    Sig: Take by mouth daily.  . Multiple Vitamins-Minerals (MULTIVITAMIN ADULTS PO)    Sig: Take by mouth daily.  . DiphenhydrAMINE HCl (BENADRYL ALLERGY PO)    Sig: Take by mouth daily.  . norethindrone-ethinyl estradiol 1/35 (ORTHO-NOVUM 1/35, 28,) tablet    Sig: Take 1 tablet by mouth daily.    Dispense:  1 Package    Refill:  11   Orders Placed This Encounter  Procedures  . NuSwab Vaginitis (VG)  . POCT urinalysis dipstick  . POCT urine pregnancy

## 2015-08-01 ENCOUNTER — Other Ambulatory Visit: Payer: Self-pay | Admitting: Obstetrics

## 2015-08-01 DIAGNOSIS — B9689 Other specified bacterial agents as the cause of diseases classified elsewhere: Secondary | ICD-10-CM

## 2015-08-01 DIAGNOSIS — N760 Acute vaginitis: Principal | ICD-10-CM

## 2015-08-01 MED ORDER — METRONIDAZOLE 500 MG PO TABS: 500.0000 mg | ORAL_TABLET | Freq: Two times a day (BID) | ORAL | Status: DC

## 2015-08-02 LAB — NUSWAB VAGINITIS (VG)
Atopobium vaginae: HIGH Score — AB
Candida albicans, NAA: NEGATIVE
Candida glabrata, NAA: NEGATIVE
MEGASPHAERA 1: HIGH {score} — AB
Trich vag by NAA: NEGATIVE

## 2015-08-03 ENCOUNTER — Encounter: Payer: Self-pay | Admitting: *Deleted

## 2015-08-03 ENCOUNTER — Other Ambulatory Visit: Payer: Self-pay | Admitting: Obstetrics

## 2015-08-05 LAB — PAP IG AND HPV HIGH-RISK
HPV, high-risk: NEGATIVE
PAP SMEAR COMMENT: 0

## 2015-12-02 ENCOUNTER — Encounter: Payer: Self-pay | Admitting: Obstetrics

## 2015-12-02 ENCOUNTER — Ambulatory Visit (INDEPENDENT_AMBULATORY_CARE_PROVIDER_SITE_OTHER): Payer: BLUE CROSS/BLUE SHIELD | Admitting: Obstetrics

## 2015-12-02 VITALS — BP 120/75 | HR 93 | Temp 98.4°F | Wt 252.6 lb

## 2015-12-02 DIAGNOSIS — Z3009 Encounter for other general counseling and advice on contraception: Secondary | ICD-10-CM | POA: Diagnosis not present

## 2015-12-02 DIAGNOSIS — N946 Dysmenorrhea, unspecified: Secondary | ICD-10-CM

## 2015-12-02 DIAGNOSIS — A499 Bacterial infection, unspecified: Secondary | ICD-10-CM

## 2015-12-02 DIAGNOSIS — N760 Acute vaginitis: Secondary | ICD-10-CM | POA: Diagnosis not present

## 2015-12-02 DIAGNOSIS — B9689 Other specified bacterial agents as the cause of diseases classified elsewhere: Secondary | ICD-10-CM

## 2015-12-02 MED ORDER — NAPROXEN SODIUM 550 MG PO TABS: 550.0000 mg | ORAL_TABLET | Freq: Two times a day (BID) | ORAL | 5 refills | Status: DC

## 2015-12-02 MED ORDER — METRONIDAZOLE 0.75 % VA GEL: 1.0000 | Freq: Two times a day (BID) | VAGINAL | 2 refills | Status: DC

## 2015-12-02 MED ORDER — METRONIDAZOLE 0.75 % VA GEL
1.0000 | Freq: Two times a day (BID) | VAGINAL | 2 refills | Status: DC
Start: 2015-12-02 — End: 2017-06-14

## 2015-12-02 NOTE — Progress Notes (Signed)
Subjective:    Lauren Garcia is a 30 y.o. female who presents for contraception counseling. The patient has no complaints today. The patient is sexually active. Pertinent past medical history: none.  The information documented in the HPI was reviewed and verified.  Menstrual History: OB History    Gravida Para Term Preterm AB Living   3 3 3  0 0 3   SAB TAB Ectopic Multiple Live Births   0 0 0 0 3       Patient's last menstrual period was 11/23/2015.   There are no active problems to display for this patient.  Past Medical History:  Diagnosis Date  . Anxiety   . Asthma    "sinus induced"  . Depression   . Pregnancy induced hypertension    with first preg  . Sinus infection   . Thrombocytopenia complicating pregnancy [facility](HCC)     Past Surgical History:  Procedure Laterality Date  . SINUS EXPLORATION    . WISDOM TOOTH EXTRACTION       Current Outpatient Prescriptions:  .  Cetirizine HCl (ZYRTEC ALLERGY PO), Take by mouth daily., Disp: , Rfl:  .  DiphenhydrAMINE HCl (BENADRYL ALLERGY PO), Take by mouth daily., Disp: , Rfl:  .  Montelukast Sodium (SINGULAIR PO), Take by mouth daily., Disp: , Rfl:  .  Multiple Vitamins-Minerals (MULTIVITAMIN ADULTS PO), Take by mouth daily., Disp: , Rfl:  .  norethindrone-ethinyl estradiol 1/35 (ORTHO-NOVUM 1/35, 28,) tablet, Take 1 tablet by mouth daily., Disp: 1 Package, Rfl: 11 .  metroNIDAZOLE (FLAGYL) 500 MG tablet, Take 1 tablet (500 mg total) by mouth 2 (two) times daily. (Patient not taking: Reported on 12/02/2015), Disp: 14 tablet, Rfl: 2 .  metroNIDAZOLE (METROGEL VAGINAL) 0.75 % vaginal gel, Place 1 Applicatorful vaginally 2 (two) times daily., Disp: 70 g, Rfl: 2 .  naproxen sodium (ANAPROX DS) 550 MG tablet, Take 1 tablet (550 mg total) by mouth 2 (two) times daily with a meal., Disp: 60 tablet, Rfl: 5 No Known Allergies  Social History  Substance Use Topics  . Smoking status: Never Smoker  . Smokeless tobacco: Never Used   . Alcohol use No     Comment: rare    Family History  Problem Relation Age of Onset  . Diabetes Maternal Grandmother   . Hypertension Maternal Grandmother   . Cancer Maternal Grandmother   . COPD Maternal Grandmother   . Arthritis Maternal Grandmother   . Arthritis Mother   . Diabetes Paternal Grandmother   . Hypertension Paternal Grandmother   . Arthritis Paternal Grandmother   . Anesthesia problems Neg Hx   . Other Neg Hx        Review of Systems Constitutional: negative for weight loss Genitourinary:negative for abnormal menstrual periods and vaginal discharge   Objective:   BP 120/75   Pulse 93   Temp 98.4 F (36.9 C)   Wt 252 lb 9.6 oz (114.6 kg)   LMP 11/23/2015 Comment: 7/27, 8/10, 8/28  Breastfeeding? No   BMI 40.77 kg/m    PE:  Deferred   Lab Review Urine pregnancy test Labs reviewed yes Radiologic studies reviewed no  >50% of 10 min visit spent on counseling and coordination of care.   Assessment:    30 y.o., continuing OCP (estrogen/progesterone), no contraindications.   Plan:    All questions answered. Discussed healthy lifestyle modifications. Follow up as needed.    Meds ordered this encounter  Medications  . DISCONTD: metroNIDAZOLE (METROGEL VAGINAL) 0.75 % vaginal gel  Sig: Place 1 Applicatorful vaginally 2 (two) times daily.    Dispense:  70 g    Refill:  2  . metroNIDAZOLE (METROGEL VAGINAL) 0.75 % vaginal gel    Sig: Place 1 Applicatorful vaginally 2 (two) times daily.    Dispense:  70 g    Refill:  2  . naproxen sodium (ANAPROX DS) 550 MG tablet    Sig: Take 1 tablet (550 mg total) by mouth 2 (two) times daily with a meal.    Dispense:  60 tablet    Refill:  5   No orders of the defined types were placed in this encounter.

## 2017-03-28 NOTE — L&D Delivery Note (Signed)
Delivery Note At 4:47 AM a viable female was delivered via Vaginal, Spontaneous (Presentation: LOA;  ).  APGAR: ,8/9 ; weight pending  After 1 minute, the cord was clamped and cut. 40 units of pitocin diluted in 1000cc LR was infused rapidly IV.  The placenta separated spontaneously and delivered via CCT and maternal pushing effort.  It was inspected and appears to be intact with a 3 VC.  Marland Kitchen     Anesthesia:  none Episiotomy: None Lacerations:  none Suture Repair:  Est. Blood Loss (mL):  123  Mom to postpartum.  Baby to couplet care  Jacklyn Shell 12/29/2017, 4:53 AM

## 2017-05-15 ENCOUNTER — Ambulatory Visit (INDEPENDENT_AMBULATORY_CARE_PROVIDER_SITE_OTHER): Payer: Managed Care, Other (non HMO)

## 2017-05-15 DIAGNOSIS — Z3201 Encounter for pregnancy test, result positive: Secondary | ICD-10-CM

## 2017-05-15 DIAGNOSIS — N912 Amenorrhea, unspecified: Secondary | ICD-10-CM

## 2017-05-15 LAB — POCT URINE PREGNANCY: Preg Test, Ur: POSITIVE — AB

## 2017-05-15 NOTE — Progress Notes (Signed)
[name] presents today for UPT. She has no unusual complaints. LMP:03/29/2017    OBJECTIVE: Appears well, in no apparent distress.  OB History    Gravida Para Term Preterm AB Living   3 3 3  0 0 3   SAB TAB Ectopic Multiple Live Births   0 0 0 0 3     Home UPT Result: Positive In-Office UPT result: Positive I have reviewed the patient's medical, obstetrical, social, and family histories, and medications.   ASSESSMENT: Positive pregnancy test  PLAN Prenatal care to be completed at: [facility]

## 2017-05-15 NOTE — Progress Notes (Signed)
I have reviewed the chart and agree with nursing staff's documentation of this patient's encounter.  Catalina AntiguaPeggy Natanael Saladin, MD 05/15/2017 8:43 AM

## 2017-05-18 ENCOUNTER — Telehealth: Payer: Self-pay

## 2017-05-18 ENCOUNTER — Other Ambulatory Visit: Payer: Self-pay

## 2017-05-18 NOTE — Telephone Encounter (Signed)
Pt called requesting that the OB complete prenatal vitamin be sent to the pharmacy. She is schedule for her new OB on 3/20

## 2017-05-19 ENCOUNTER — Other Ambulatory Visit: Payer: Self-pay | Admitting: Certified Nurse Midwife

## 2017-05-19 DIAGNOSIS — Z3480 Encounter for supervision of other normal pregnancy, unspecified trimester: Secondary | ICD-10-CM

## 2017-05-19 MED ORDER — OB COMPLETE PETITE 35-5-1-200 MG PO CAPS: 1.0000 | ORAL_CAPSULE | Freq: Every day | ORAL | 12 refills | Status: DC

## 2017-05-19 NOTE — Telephone Encounter (Signed)
Please let her know that it was sent to the pharmacy.  She can go online to their website for the coupon.  Thank you Boykin Reaperachelle

## 2017-06-14 ENCOUNTER — Ambulatory Visit (INDEPENDENT_AMBULATORY_CARE_PROVIDER_SITE_OTHER): Payer: Managed Care, Other (non HMO) | Admitting: Certified Nurse Midwife

## 2017-06-14 ENCOUNTER — Encounter: Payer: Self-pay | Admitting: Certified Nurse Midwife

## 2017-06-14 VITALS — BP 133/91 | HR 100 | Wt 239.4 lb

## 2017-06-14 DIAGNOSIS — O09291 Supervision of pregnancy with other poor reproductive or obstetric history, first trimester: Secondary | ICD-10-CM

## 2017-06-14 DIAGNOSIS — Z3481 Encounter for supervision of other normal pregnancy, first trimester: Secondary | ICD-10-CM

## 2017-06-14 DIAGNOSIS — Z8759 Personal history of other complications of pregnancy, childbirth and the puerperium: Secondary | ICD-10-CM | POA: Insufficient documentation

## 2017-06-14 DIAGNOSIS — Z113 Encounter for screening for infections with a predominantly sexual mode of transmission: Secondary | ICD-10-CM

## 2017-06-14 DIAGNOSIS — Z3480 Encounter for supervision of other normal pregnancy, unspecified trimester: Secondary | ICD-10-CM | POA: Insufficient documentation

## 2017-06-14 DIAGNOSIS — Z124 Encounter for screening for malignant neoplasm of cervix: Secondary | ICD-10-CM

## 2017-06-14 DIAGNOSIS — O219 Vomiting of pregnancy, unspecified: Secondary | ICD-10-CM

## 2017-06-14 DIAGNOSIS — Z1151 Encounter for screening for human papillomavirus (HPV): Secondary | ICD-10-CM

## 2017-06-14 MED ORDER — DOXYLAMINE-PYRIDOXINE ER 20-20 MG PO TBCR: 1.0000 | EXTENDED_RELEASE_TABLET | Freq: Two times a day (BID) | ORAL | 6 refills | Status: DC

## 2017-06-14 MED ORDER — PROMETHAZINE HCL 12.5 MG PO TABS: 12.5000 mg | ORAL_TABLET | Freq: Four times a day (QID) | ORAL | 0 refills | Status: DC | PRN

## 2017-06-14 MED ORDER — ASPIRIN 81 MG PO CHEW
81.0000 mg | CHEWABLE_TABLET | Freq: Every day | ORAL | 12 refills | Status: DC
Start: 2017-06-14 — End: 2018-10-24

## 2017-06-14 NOTE — Progress Notes (Signed)
Subjective:   Lauren Garcia is a 32 y.o. G4P3003 at 69w0dby LMP being seen today for her first obstetrical visit.  Her obstetrical history is significant for obesity and pregnancy induced hypertension. Patient does intend to breast feed. Pregnancy history fully reviewed.  Works for [employer]   Patient reports nausea, no bleeding, no contractions, no cramping, no leaking and vomiting.  HISTORY: Obstetric History   G4   P3   T3   P0   A0   L3    SAB0   TAB0   Ectopic0   Multiple0   Live Births3     # Outcome Date GA Lbr Len/2nd Weight Sex Delivery Anes PTL Lv  4 Current           3 Term 11/03/11 354w4d4:06 / 00:21 6 lb 15.5 oz (3.16 kg) F Vag-Spont Other, EPI  LIV     Name: Lauren Garcia     Apgar1:  9                Apgar5: 9  2 Term 2010 4033w0d:00 7 lb 5 oz (3.317 kg) M Vag-Spont   LIV  1 Term 2007 40w51w0d00 8 lb (3.629 kg) M Vag-Spont EPI  LIV      Last pap smear was done unknown  Past Medical History:  Diagnosis Date  . Anxiety   . Asthma    "sinus induced"  . Depression   . Pregnancy induced hypertension    with first preg  . Sinus infection   [facility] Past Surgical History:  Procedure Laterality Date  . SINUS EXPLORATION    . WISDOM TOOTH EXTRACTION     Family History  Problem Relation Age of Onset  . Diabetes Maternal Grandmother   . Hypertension Maternal Grandmother   . Cancer Maternal Grandmother   . COPD Maternal Grandmother   . Arthritis Maternal Grandmother   . Arthritis Mother   . Diabetes Paternal Grandmother   . Hypertension Paternal Grandmother   . Arthritis Paternal Grandmother   . Anesthesia problems Neg Hx   . Other Neg Hx    Social History   Tobacco Use  . Smoking status: Never Smoker  . Smokeless tobacco: Never Used  Substance Use Topics  . Alcohol use: No    Comment: rare  . Drug use: No   No Known Allergies Current Outpatient Medications on File Prior to  Visit  Medication Sig Dispense Refill  . Cetirizine HCl (ZYRTEC ALLERGY PO) Take by mouth daily.    . Prenat-FeCbn-FeAspGl-FA-Omega (OB COMPLETE PETITE) 35-5-1-200 MG CAPS Take 1 tablet by mouth daily. 30 capsule 12  . Prenatal MV-Min-FA-Omega-3 (PRENATAL GUMMIES/DHA & FA) 0.4-32.5 MG CHEW Chew by mouth.    . Probiotic Product (PROBIOTIC-10) CAPS Take by mouth.     No current facility-administered medications on file prior to visit.     Review of Systems Pertinent items noted in HPI and remainder of comprehensive ROS otherwise negative.  Exam   Vitals:   06/14/17 1016  BP: (!) 133/91  Pulse: 100  Weight: 239 lb 6.4 oz (108.6 kg)   Fetal Heart Rate (bpm): 165; doppler  Uterus:     Pelvic Exam: Perineum: no hemorrhoids, normal perineum   Vulva: normal external genitalia, no lesions   Vagina:  normal mucosa, normal discharge   Cervix: no lesions and normal, pap smear done.    Adnexa: normal adnexa and no mass, fullness, tenderness  Bony Pelvis: average  System: General: well-developed, well-nourished female in no acute distress   Breast:  normal appearance, no masses or tenderness   Skin: normal coloration and turgor, no rashes   Neurologic: oriented, normal, negative, normal mood   Extremities: normal strength, tone, and muscle mass, ROM of all joints is normal   HEENT PERRLA, extraocular movement intact and sclera clear, anicteric   Mouth/Teeth mucous membranes moist, pharynx normal without lesions and dental hygiene good   Neck supple and no masses   Cardiovascular: regular rate and rhythm   Respiratory:  no respiratory distress, normal breath sounds   Abdomen: soft, non-tender; bowel sounds normal; no masses,  no organomegaly     Assessment:   Pregnancy: [id number] Patient Active Problem List   Diagnosis Date Noted  . Supervision of other normal pregnancy, antepartum 06/14/2017     Plan:  1. Supervision of other normal pregnancy, antepartum    - Cytology - PAP -  Cervicovaginal ancillary only - Culture, OB Urine - Hemoglobinopathy evaluation - Obstetric Panel, Including HIV - VITAMIN D 25 Hydroxy (Vit-D Deficiency, Fractures) - Inheritest Core(CF97,SMA,FraX) - Hemoglobin A1c - [place] MFM OB DETAIL +14 WK; Future  2. Morbidly obese (HCC)    - Hemoglobin A1c - [place] MFM OB DETAIL +14 WK; Future  3. Nausea/vomiting in pregnancy     - Doxylamine-Pyridoxine ER (BONJESTA) 20-20 MG TBCR; Take 1 tablet by mouth 2 (two) times daily.  Dispense: 60 tablet; Refill: 6 - promethazine (PHENERGAN) 12.5 MG tablet; Take 1 tablet (12.5 mg total) by mouth every 6 (six) hours as needed for nausea or vomiting.  Dispense: 30 tablet; Refill: 0  4. History of pre-eclampsia in prior pregnancy, currently pregnant in first trimester    - aspirin 81 MG chewable tablet; Chew 1 tablet (81 mg total) by mouth daily.  Dispense: 30 tablet; Refill: 12 - Comp Met (CMET) - Protein / creatinine ratio, urine   Initial labs drawn. Continue prenatal vitamins. Genetic Screening discussed, NIPS: declined. Ultrasound discussed; fetal anatomic survey: ordered. Problem list reviewed and updated. The nature of Lauren Garcia with multiple MDs and other Advanced Practice Providers was explained to patient; also emphasized that residents, students are part of our team. Routine obstetric precautions reviewed. Return in about 4 weeks (around 07/12/2017) for ROB.     Kandis Cocking, [place] for [employer], [place]

## 2017-06-15 ENCOUNTER — Other Ambulatory Visit: Payer: Self-pay | Admitting: Certified Nurse Midwife

## 2017-06-15 DIAGNOSIS — Z3480 Encounter for supervision of other normal pregnancy, unspecified trimester: Secondary | ICD-10-CM

## 2017-06-15 DIAGNOSIS — O26899 Other specified pregnancy related conditions, unspecified trimester: Secondary | ICD-10-CM

## 2017-06-15 DIAGNOSIS — Z6791 Unspecified blood type, Rh negative: Secondary | ICD-10-CM | POA: Insufficient documentation

## 2017-06-15 LAB — HEMOGLOBINOPATHY EVALUATION
HGB C: 0 %
HGB S: 0 %
HGB VARIANT: 0 %
Hemoglobin A2 Quantitation: 2.7 % (ref 1.8–3.2)
Hemoglobin F Quantitation: 0 % (ref 0.0–2.0)
Hgb A: 97.3 % (ref 96.4–98.8)

## 2017-06-15 LAB — OBSTETRIC PANEL, INCLUDING HIV
Antibody Screen: NEGATIVE
BASOS ABS: 0 10*3/uL (ref 0.0–0.2)
Basos: 0 %
EOS (ABSOLUTE): 0.1 10*3/uL (ref 0.0–0.4)
Eos: 1 %
HIV SCREEN 4TH GENERATION: NONREACTIVE
Hematocrit: 38.4 % (ref 34.0–46.6)
Hemoglobin: 12.8 g/dL (ref 11.1–15.9)
Hepatitis B Surface Ag: NEGATIVE
IMMATURE GRANS (ABS): 0 10*3/uL (ref 0.0–0.1)
Immature Granulocytes: 0 %
LYMPHS: 27 %
Lymphocytes Absolute: 2.3 10*3/uL (ref 0.7–3.1)
MCH: 31.9 pg (ref 26.6–33.0)
MCHC: 33.3 g/dL (ref 31.5–35.7)
MCV: 96 fL (ref 79–97)
MONOCYTES: 5 %
MONOS ABS: 0.4 10*3/uL (ref 0.1–0.9)
NEUTROS ABS: 5.8 10*3/uL (ref 1.4–7.0)
Neutrophils: 67 %
PLATELETS: 122 10*3/uL — AB (ref 150–379)
RBC: 4.01 x10E6/uL (ref 3.77–5.28)
RDW: 12.9 % (ref 12.3–15.4)
RPR Ser Ql: NONREACTIVE
RUBELLA: 1 {index} (ref >0.99)
Rh Factor: NEGATIVE
WBC: 8.6 10*3/uL (ref 3.4–10.8)

## 2017-06-15 LAB — CERVICOVAGINAL ANCILLARY ONLY
BACTERIAL VAGINITIS: NEGATIVE
Candida vaginitis: NEGATIVE
Chlamydia: NEGATIVE
Neisseria Gonorrhea: NEGATIVE
Trichomonas: NEGATIVE

## 2017-06-15 LAB — COMPREHENSIVE METABOLIC PANEL
ALK PHOS: 76 IU/L (ref 39–117)
ALT: 14 IU/L (ref 0–32)
AST: 11 IU/L (ref 0–40)
Albumin/Globulin Ratio: 1.5 (ref 1.2–2.2)
Albumin: 4 g/dL (ref 3.5–5.5)
BILIRUBIN TOTAL: 0.3 mg/dL (ref 0.0–1.2)
BUN / CREAT RATIO: 9 (ref 9–23)
BUN: 5 mg/dL — ABNORMAL LOW (ref 6–20)
CHLORIDE: 103 mmol/L (ref 96–106)
CO2: 20 mmol/L (ref 20–29)
CREATININE: 0.54 mg/dL — AB (ref 0.57–1.00)
Calcium: 9 mg/dL (ref 8.7–10.2)
GFR calc Af Amer: 143 mL/min/{1.73_m2} (ref >59)
GFR calc non Af Amer: 124 mL/min/{1.73_m2} (ref >59)
GLOBULIN, TOTAL: 2.7 g/dL (ref 1.5–4.5)
Glucose: 82 mg/dL (ref 65–99)
POTASSIUM: 3.9 mmol/L (ref 3.5–5.2)
SODIUM: 138 mmol/L (ref 134–144)
Total Protein: 6.7 g/dL (ref 6.0–8.5)

## 2017-06-15 LAB — VITAMIN D 25 HYDROXY (VIT D DEFICIENCY, FRACTURES): VIT D 25 HYDROXY: 30 ng/mL (ref 30.0–100.0)

## 2017-06-15 LAB — HEMOGLOBIN A1C
Est. average glucose Bld gHb Est-mCnc: 97 mg/dL
HEMOGLOBIN A1C: 5 % (ref 4.8–5.6)

## 2017-06-15 LAB — PROTEIN / CREATININE RATIO, URINE
CREATININE, UR: 202.5 mg/dL
PROTEIN UR: 23.7 mg/dL
Protein/Creat Ratio: 117 mg/g creat (ref 0–200)

## 2017-06-16 LAB — CYTOLOGY - PAP
DIAGNOSIS: NEGATIVE
HPV: NOT DETECTED

## 2017-06-16 LAB — CULTURE, OB URINE

## 2017-06-16 LAB — URINE CULTURE, OB REFLEX

## 2017-06-24 LAB — INHERITEST CORE(CF97,SMA,FRAX)

## 2017-06-26 ENCOUNTER — Other Ambulatory Visit: Payer: Self-pay | Admitting: Certified Nurse Midwife

## 2017-06-26 DIAGNOSIS — Z3480 Encounter for supervision of other normal pregnancy, unspecified trimester: Secondary | ICD-10-CM

## 2017-06-28 ENCOUNTER — Encounter: Payer: Self-pay | Admitting: Obstetrics and Gynecology

## 2017-06-28 DIAGNOSIS — O99119 Other diseases of the blood and blood-forming organs and certain disorders involving the immune mechanism complicating pregnancy, unspecified trimester: Secondary | ICD-10-CM

## 2017-06-28 DIAGNOSIS — D696 Thrombocytopenia, unspecified: Secondary | ICD-10-CM | POA: Insufficient documentation

## 2017-07-13 ENCOUNTER — Encounter: Payer: Self-pay | Admitting: Certified Nurse Midwife

## 2017-07-13 ENCOUNTER — Ambulatory Visit (INDEPENDENT_AMBULATORY_CARE_PROVIDER_SITE_OTHER): Payer: Managed Care, Other (non HMO) | Admitting: Certified Nurse Midwife

## 2017-07-13 VITALS — BP 120/87 | HR 99 | Wt 239.0 lb

## 2017-07-13 DIAGNOSIS — D696 Thrombocytopenia, unspecified: Secondary | ICD-10-CM

## 2017-07-13 DIAGNOSIS — Z6791 Unspecified blood type, Rh negative: Secondary | ICD-10-CM

## 2017-07-13 DIAGNOSIS — O99119 Other diseases of the blood and blood-forming organs and certain disorders involving the immune mechanism complicating pregnancy, unspecified trimester: Secondary | ICD-10-CM

## 2017-07-13 DIAGNOSIS — O26899 Other specified pregnancy related conditions, unspecified trimester: Secondary | ICD-10-CM

## 2017-07-13 DIAGNOSIS — O99112 Other diseases of the blood and blood-forming organs and certain disorders involving the immune mechanism complicating pregnancy, second trimester: Secondary | ICD-10-CM

## 2017-07-13 DIAGNOSIS — Z3480 Encounter for supervision of other normal pregnancy, unspecified trimester: Secondary | ICD-10-CM

## 2017-07-13 DIAGNOSIS — O26892 Other specified pregnancy related conditions, second trimester: Secondary | ICD-10-CM

## 2017-07-13 DIAGNOSIS — Z3482 Encounter for supervision of other normal pregnancy, second trimester: Secondary | ICD-10-CM

## 2017-07-13 NOTE — Progress Notes (Signed)
   PRENATAL VISIT NOTE  Subjective:  Lauren Garcia is a 32 y.o. G4P3003 at 8434w1d being seen today for ongoing prenatal care.  She is currently monitored for the following issues for this low-risk pregnancy and has Supervision of other normal pregnancy, antepartum; Morbidly obese (HCC); History of pre-eclampsia in prior pregnancy, currently pregnant in first trimester; Nausea/vomiting in pregnancy; Rh negative state in antepartum period; and Thrombocytopenia affecting pregnancy (HCC) on their problem list.  Patient reports no complaints.  Contractions: Not present. Vag. Bleeding: None.  Movement: Absent. Denies leaking of fluid.   The following portions of the patient's history were reviewed and updated as appropriate: allergies, current medications, past family history, past medical history, past social history, past surgical history and problem list. Problem list updated.  Objective:   Vitals:   07/13/17 0905  BP: 120/87  Pulse: 99  Weight: 239 lb (108.4 kg)    Fetal Status: Fetal Heart Rate (bpm): 142; doppler   Movement: Absent     General:  Alert, oriented and cooperative. Patient is in no acute distress.  Skin: Skin is warm and dry. No rash noted.   Cardiovascular: Normal heart rate noted  Respiratory: Normal respiratory effort, no problems with respiration noted  Abdomen: Soft, gravid, appropriate for gestational age.  Pain/Pressure: Present     Pelvic: Cervical exam deferred        Extremities: Normal range of motion.  Edema: None  Mental Status: Normal mood and affect. Normal behavior. Normal judgment and thought content.   Assessment and Plan:  Pregnancy: G4P3003 at 8734w1d  1. Supervision of other normal pregnancy, antepartum     Doing well.  N&V has improved.   2. Rh negative state in antepartum period     Rhogam at 28 wks  3. Thrombocytopenia affecting pregnancy (HCC)      >100K, stable  Preterm labor symptoms and general obstetric precautions including  but not limited to vaginal bleeding, contractions, leaking of fluid and fetal movement were reviewed in detail with the patient. Please refer to After Visit Summary for other counseling recommendations.  Return in about 1 month (around 08/10/2017) for ROB.  Future Appointments  Date Time Provider Department Center  08/07/2017  9:00 AM [facility] US 3 [facility]    Roe Coombsachelle A Minaal Struckman, CNM

## 2017-07-26 ENCOUNTER — Telehealth: Payer: Self-pay

## 2017-07-26 ENCOUNTER — Other Ambulatory Visit: Payer: Self-pay | Admitting: Certified Nurse Midwife

## 2017-07-26 NOTE — Telephone Encounter (Signed)
Tell her that One A Day PNV  PRental vitamin is a good brand to try OTC.  [employer] does not pay for PNV.   Thank you. R.Denney CNM

## 2017-07-26 NOTE — Telephone Encounter (Signed)
TC from pt requesting different prenatal vitamin pt states she is unsure which to try.   Please advise.

## 2017-08-07 ENCOUNTER — Other Ambulatory Visit (HOSPITAL_COMMUNITY): Payer: Self-pay | Admitting: *Deleted

## 2017-08-07 ENCOUNTER — Ambulatory Visit (HOSPITAL_COMMUNITY)
Admission: RE | Admit: 2017-08-07 | Discharge: 2017-08-07 | Disposition: A | Discharge status: Home / Self Care | Payer: Managed Care, Other (non HMO) | Source: Ambulatory Visit | Attending: Certified Nurse Midwife | Admitting: Certified Nurse Midwife

## 2017-08-07 ENCOUNTER — Encounter (HOSPITAL_COMMUNITY): Payer: Self-pay

## 2017-08-07 ENCOUNTER — Other Ambulatory Visit: Payer: Self-pay | Admitting: Certified Nurse Midwife

## 2017-08-07 DIAGNOSIS — Z3A18 18 weeks gestation of pregnancy: Secondary | ICD-10-CM | POA: Diagnosis not present

## 2017-08-07 DIAGNOSIS — O99212 Obesity complicating pregnancy, second trimester: Secondary | ICD-10-CM | POA: Insufficient documentation

## 2017-08-07 DIAGNOSIS — O09292 Supervision of pregnancy with other poor reproductive or obstetric history, second trimester: Secondary | ICD-10-CM | POA: Insufficient documentation

## 2017-08-07 DIAGNOSIS — O09299 Supervision of pregnancy with other poor reproductive or obstetric history, unspecified trimester: Secondary | ICD-10-CM

## 2017-08-07 DIAGNOSIS — Z3480 Encounter for supervision of other normal pregnancy, unspecified trimester: Secondary | ICD-10-CM

## 2017-08-07 DIAGNOSIS — Z363 Encounter for antenatal screening for malformations: Secondary | ICD-10-CM | POA: Diagnosis present

## 2017-08-07 DIAGNOSIS — Z362 Encounter for other antenatal screening follow-up: Secondary | ICD-10-CM

## 2017-08-07 NOTE — Addendum Note (Signed)
Encounter addended by: Levonne Hubert, RDMS, RVT on: 08/07/2017 11:29 AM  Actions taken: Imaging Exam ended

## 2017-08-14 ENCOUNTER — Encounter: Payer: Self-pay | Admitting: Certified Nurse Midwife

## 2017-08-14 ENCOUNTER — Ambulatory Visit (INDEPENDENT_AMBULATORY_CARE_PROVIDER_SITE_OTHER): Payer: Managed Care, Other (non HMO) | Admitting: Certified Nurse Midwife

## 2017-08-14 VITALS — BP 130/84 | HR 96 | Wt 245.0 lb

## 2017-08-14 DIAGNOSIS — Z6791 Unspecified blood type, Rh negative: Secondary | ICD-10-CM

## 2017-08-14 DIAGNOSIS — Z3480 Encounter for supervision of other normal pregnancy, unspecified trimester: Secondary | ICD-10-CM

## 2017-08-14 DIAGNOSIS — O26899 Other specified pregnancy related conditions, unspecified trimester: Secondary | ICD-10-CM

## 2017-08-14 DIAGNOSIS — Z3482 Encounter for supervision of other normal pregnancy, second trimester: Secondary | ICD-10-CM

## 2017-08-14 DIAGNOSIS — O26892 Other specified pregnancy related conditions, second trimester: Secondary | ICD-10-CM

## 2017-08-14 DIAGNOSIS — R102 Pelvic and perineal pain: Secondary | ICD-10-CM

## 2017-08-14 DIAGNOSIS — O99212 Obesity complicating pregnancy, second trimester: Secondary | ICD-10-CM

## 2017-08-14 MED ORDER — COMFORT FIT MATERNITY SUPP LG MISC: 1.0000 [IU] | Freq: Every day | 0 refills | Status: DC

## 2017-08-14 NOTE — Progress Notes (Signed)
   PRENATAL VISIT NOTE  Subjective:  Lauren Garcia is a 32 y.o. G4P3003 at [redacted]w[redacted]d being seen today for ongoing prenatal care.  She is currently monitored for the following issues for this low-risk pregnancy and has Supervision of other normal pregnancy, antepartum; Morbidly obese (HCC); History of pre-eclampsia in prior pregnancy, currently pregnant in first trimester; Nausea/vomiting in pregnancy; Rh negative state in antepartum period; and Thrombocytopenia affecting pregnancy (HCC) on their problem list.  Patient reports no bleeding, no contractions, no cramping, no leaking and pelvic pressure especially when ambulating\.  Contractions: Not present. Vag. Bleeding: None.  Movement: Present. Denies leaking of fluid.   The following portions of the patient's history were reviewed and updated as appropriate: allergies, current medications, past family history, past medical history, past social history, past surgical history and problem list. Problem list updated.  Objective:   Vitals:   08/14/17 0857  BP: 130/84  Pulse: 96  Weight: 245 lb (111.1 kg)    Fetal Status:     Movement: Present     General:  Alert, oriented and cooperative. Patient is in no acute distress.  Skin: Skin is warm and dry. No rash noted.   Cardiovascular: Normal heart rate noted  Respiratory: Normal respiratory effort, no problems with respiration noted  Abdomen: Soft, gravid, appropriate for gestational age.  Pain/Pressure: Present     Pelvic: Cervical exam deferred        Extremities: Normal range of motion.     Mental Status: Normal mood and affect. Normal behavior. Normal judgment and thought content.   Assessment and Plan:  Pregnancy: G4P3003 at [redacted]w[redacted]d  1. Supervision of other normal pregnancy, antepartum     Doing well. Normal discomforts of pregnancy.   2. Rh negative state in antepartum period     Rhogam at 28 weeks planned  3. Morbidly obese (HCC)     Has lost 10 lbs  4. Pelvic pain  affecting pregnancy in second trimester, antepartum      - Elastic Bandages & Supports (COMFORT FIT MATERNITY SUPP LG) MISC; 1 Units by Does not apply route daily.  Dispense: 1 each; Refill: 0  Preterm labor symptoms and general obstetric precautions including but not limited to vaginal bleeding, contractions, leaking of fluid and fetal movement were reviewed in detail with the patient. Please refer to After Visit Summary for other counseling recommendations.  Return in about 1 month (around 09/11/2017) for ROB.  Future Appointments  Date Time Provider Department Center  09/04/2017  9:00 AM [facility] [place] 3 [facility]    Roe Coombs, CNM

## 2017-08-29 ENCOUNTER — Encounter: Payer: Self-pay | Admitting: *Deleted

## 2017-08-29 NOTE — Progress Notes (Signed)
Letter composed and faxed to dentist office today for pt appt.

## 2017-09-04 ENCOUNTER — Other Ambulatory Visit (HOSPITAL_COMMUNITY): Payer: Self-pay | Admitting: *Deleted

## 2017-09-04 ENCOUNTER — Encounter (HOSPITAL_COMMUNITY): Payer: Self-pay

## 2017-09-04 ENCOUNTER — Ambulatory Visit (HOSPITAL_COMMUNITY)
Admission: RE | Admit: 2017-09-04 | Discharge: 2017-09-04 | Disposition: A | Discharge status: Home / Self Care | Payer: Managed Care, Other (non HMO) | Source: Ambulatory Visit | Attending: Certified Nurse Midwife | Admitting: Certified Nurse Midwife

## 2017-09-04 ENCOUNTER — Other Ambulatory Visit (HOSPITAL_COMMUNITY): Payer: Self-pay | Admitting: Obstetrics and Gynecology

## 2017-09-04 DIAGNOSIS — O99212 Obesity complicating pregnancy, second trimester: Secondary | ICD-10-CM

## 2017-09-04 DIAGNOSIS — O09299 Supervision of pregnancy with other poor reproductive or obstetric history, unspecified trimester: Secondary | ICD-10-CM

## 2017-09-04 DIAGNOSIS — Z3A22 22 weeks gestation of pregnancy: Secondary | ICD-10-CM | POA: Insufficient documentation

## 2017-09-04 DIAGNOSIS — Z362 Encounter for other antenatal screening follow-up: Secondary | ICD-10-CM

## 2017-09-04 DIAGNOSIS — O09293 Supervision of pregnancy with other poor reproductive or obstetric history, third trimester: Secondary | ICD-10-CM | POA: Insufficient documentation

## 2017-09-11 ENCOUNTER — Ambulatory Visit (INDEPENDENT_AMBULATORY_CARE_PROVIDER_SITE_OTHER): Payer: Managed Care, Other (non HMO) | Admitting: Certified Nurse Midwife

## 2017-09-11 ENCOUNTER — Encounter: Payer: Self-pay | Admitting: Certified Nurse Midwife

## 2017-09-11 VITALS — BP 107/74 | HR 83 | Wt 247.6 lb

## 2017-09-11 DIAGNOSIS — O26899 Other specified pregnancy related conditions, unspecified trimester: Secondary | ICD-10-CM

## 2017-09-11 DIAGNOSIS — Z8759 Personal history of other complications of pregnancy, childbirth and the puerperium: Secondary | ICD-10-CM

## 2017-09-11 DIAGNOSIS — O99212 Obesity complicating pregnancy, second trimester: Secondary | ICD-10-CM

## 2017-09-11 DIAGNOSIS — Z3480 Encounter for supervision of other normal pregnancy, unspecified trimester: Secondary | ICD-10-CM

## 2017-09-11 DIAGNOSIS — Z3482 Encounter for supervision of other normal pregnancy, second trimester: Secondary | ICD-10-CM

## 2017-09-11 DIAGNOSIS — O26892 Other specified pregnancy related conditions, second trimester: Secondary | ICD-10-CM

## 2017-09-11 DIAGNOSIS — Z6791 Unspecified blood type, Rh negative: Secondary | ICD-10-CM

## 2017-09-11 NOTE — Progress Notes (Signed)
   PRENATAL VISIT NOTE  Subjective:  Lauren Garcia is a 32 y.o. G4P3003 at 2361w5d being seen today for ongoing prenatal care.  She is currently monitored for the following issues for this low-risk pregnancy and has Supervision of other normal pregnancy, antepartum; Morbidly obese (HCC); History of pre-eclampsia; Nausea/vomiting in pregnancy; Rh negative state in antepartum period; Thrombocytopenia affecting pregnancy (HCC); [redacted] weeks gestation of pregnancy; Obesity affecting pregnancy in second trimester; and Pregnancy with poor obstetric history on their problem list.  Patient reports no complaints.  Contractions: Not present. Vag. Bleeding: None.  Movement: Present. Denies leaking of fluid.   The following portions of the patient's history were reviewed and updated as appropriate: allergies, current medications, past family history, past medical history, past social history, past surgical history and problem list. Problem list updated.  Objective:   Vitals:   09/11/17 1112  BP: 107/74  Pulse: 83  Weight: 247 lb 9.6 oz (112.3 kg)    Fetal Status: Fetal Heart Rate (bpm): 142; doppler Fundal Height: 24 cm Movement: Present     General:  Alert, oriented and cooperative. Patient is in no acute distress.  Skin: Skin is warm and dry. No rash noted.   Cardiovascular: Normal heart rate noted  Respiratory: Normal respiratory effort, no problems with respiration noted  Abdomen: Soft, gravid, appropriate for gestational age.  Pain/Pressure: Present     Pelvic: Cervical exam deferred        Extremities: Normal range of motion.  Edema: Trace  Mental Status: Normal mood and affect. Normal behavior. Normal judgment and thought content.   Assessment and Plan:  Pregnancy: G4P3003 at 8961w5d  1. Supervision of other normal pregnancy, antepartum      Doing well.  Has f/u anatomy US scheduled.   2. Rh negative state in antepartum period     Rhogam at next visit with 2 hour OGTT  3.  Obesity affecting pregnancy in second trimester      Has lost 7lbs this pregnancy  4. History of pre-eclampsia     Taking baby ASA, normotensive today.   Preterm labor symptoms and general obstetric precautions including but not limited to vaginal bleeding, contractions, leaking of fluid and fetal movement were reviewed in detail with the patient. Please refer to After Visit Summary for other counseling recommendations.  Return in about 1 month (around 10/09/2017) for ROB, 2 hr OGTT with Rhogam.  Future Appointments  Date Time Provider Department Center  10/16/2017  8:10 AM Leftwich-Kirby, Wilmer FloorLisa A, CNM [facility] None  10/17/2017  2:00 PM [facility] US 3 [facility]    Roe Coombsachelle A Cha Gomillion, CNM

## 2017-09-11 NOTE — Progress Notes (Signed)
Patient reports good fetal movement with occasional pressure. 

## 2017-10-16 ENCOUNTER — Other Ambulatory Visit: Payer: Managed Care, Other (non HMO)

## 2017-10-16 ENCOUNTER — Ambulatory Visit (INDEPENDENT_AMBULATORY_CARE_PROVIDER_SITE_OTHER): Payer: Managed Care, Other (non HMO) | Admitting: Advanced Practice Midwife

## 2017-10-16 VITALS — BP 114/76 | HR 90 | Wt 252.2 lb

## 2017-10-16 DIAGNOSIS — O99213 Obesity complicating pregnancy, third trimester: Secondary | ICD-10-CM

## 2017-10-16 DIAGNOSIS — O26899 Other specified pregnancy related conditions, unspecified trimester: Secondary | ICD-10-CM

## 2017-10-16 DIAGNOSIS — Z6791 Unspecified blood type, Rh negative: Secondary | ICD-10-CM

## 2017-10-16 DIAGNOSIS — Z8759 Personal history of other complications of pregnancy, childbirth and the puerperium: Secondary | ICD-10-CM

## 2017-10-16 DIAGNOSIS — O360930 Maternal care for other rhesus isoimmunization, third trimester, not applicable or unspecified: Secondary | ICD-10-CM

## 2017-10-16 DIAGNOSIS — O99212 Obesity complicating pregnancy, second trimester: Secondary | ICD-10-CM

## 2017-10-16 DIAGNOSIS — Z23 Encounter for immunization: Secondary | ICD-10-CM | POA: Diagnosis not present

## 2017-10-16 DIAGNOSIS — Z3483 Encounter for supervision of other normal pregnancy, third trimester: Secondary | ICD-10-CM

## 2017-10-16 DIAGNOSIS — Z3480 Encounter for supervision of other normal pregnancy, unspecified trimester: Secondary | ICD-10-CM

## 2017-10-16 DIAGNOSIS — E669 Obesity, unspecified: Secondary | ICD-10-CM

## 2017-10-16 MED ORDER — RHO D IMMUNE GLOBULIN 1500 UNIT/2ML IJ SOSY
300.0000 ug | PREFILLED_SYRINGE | Freq: Once | INTRAMUSCULAR | Status: AC
  Administered 2017-10-16: 300 ug via INTRAMUSCULAR

## 2017-10-16 NOTE — Progress Notes (Signed)
   PRENATAL VISIT NOTE  Subjective:  Lauren Garcia is a 32 y.o. G4P3003 at 7634w5d being seen today for ongoing prenatal care.  She is currently monitored for the following issues for this low-risk pregnancy and has Supervision of other normal pregnancy, antepartum; Morbidly obese (HCC); History of pre-eclampsia; Nausea/vomiting in pregnancy; Rh negative state in antepartum period; Thrombocytopenia affecting pregnancy (HCC); [redacted] weeks gestation of pregnancy; Obesity affecting pregnancy in second trimester; and Pregnancy with poor obstetric history on their problem list.  Patient reports occasional contractions.  Contractions: Irregular. Vag. Bleeding: None.  Movement: Present. Denies leaking of fluid.   The following portions of the patient's history were reviewed and updated as appropriate: allergies, current medications, past family history, past medical history, past social history, past surgical history and problem list. Problem list updated.  Objective:   Vitals:   10/16/17 0815  BP: 114/76  Pulse: 90  Weight: 252 lb 3.2 oz (114.4 kg)    Fetal Status: Fetal Heart Rate (bpm): 147   Movement: Present     General:  Alert, oriented and cooperative. Patient is in no acute distress.  Skin: Skin is warm and dry. No rash noted.   Cardiovascular: Normal heart rate noted  Respiratory: Normal respiratory effort, no problems with respiration noted  Abdomen: Soft, gravid, appropriate for gestational age.  Pain/Pressure: Absent     Pelvic: Cervical exam deferred        Extremities: Normal range of motion.  Edema: None  Mental Status: Normal mood and affect. Normal behavior. Normal judgment and thought content.   Assessment and Plan:  Pregnancy: G4P3003 at 1634w5d  1. Rh negative state in antepartum period - rho (d) immune globulin (RHIG/RHOPHYLAC) injection 300 mcg  2. Supervision of other normal pregnancy, antepartum --Anticipatory guidance about next visits/weeks of pregnancy  given. --Pt with questions about our practice with recent changes Is worried about multiple providers.  Discussed [facility] as teaching service, with Family Practice residents, medical students, Fellows, CNMs, NP/PAs, and OB/Gyns as part care.  Had all previous deliveries with [facility] or [facility], knew her doctors and who would be there.   --Request added to sticky note to reduce number of providers, no residents/students.   - Glucose Tolerance, 2 Hours w/1 Hour - HIV antibody - RPR - CBC - Tdap vaccine greater than or equal to 7yo IM  3. History of pre-eclampsia --With 3 previous pregnancies, all developing at term requiring IOL.   --On ASA this pregnancy (first time taking ASA during pregnancy) --BP wnl today  4. Obesity affecting pregnancy in second trimester --Overall weight down 2 lbs during this pregnancy with 16 lb weight loss in first trimester, then weight gain back to baseline.  Discussed diet, pt making healthy choices, eating more fruits and vegetables and lean meats.  Preterm labor symptoms and general obstetric precautions including but not limited to vaginal bleeding, contractions, leaking of fluid and fetal movement were reviewed in detail with the patient. Please refer to After Visit Summary for other counseling recommendations.  Return in about 2 weeks (around 10/30/2017).  Future Appointments  Date Time Provider Department Center  10/17/2017  2:00 PM [facility] US 3 [facility]  10/31/2017  8:45 AM Brock BadHarper, Charles A, MD [facility] None    Sharen CounterLisa Leftwich-Kirby, CNM

## 2017-10-16 NOTE — Patient Instructions (Signed)
Third Trimester of Pregnancy The third trimester is from week 28 through week 40 (months 7 through 9). The third trimester is a time when the unborn baby (fetus) is growing rapidly. At the end of the ninth month, the fetus is about 20 inches in length and weighs 6-10 pounds. Body changes during your third trimester Your body will continue to go through many changes during pregnancy. The changes vary from woman to woman. During the third trimester:  Your weight will continue to increase. You can expect to gain 25-35 pounds (11-16 kg) by the end of the pregnancy.  You may begin to get stretch marks on your hips, abdomen, and breasts.  You may urinate more often because the fetus is moving lower into your pelvis and pressing on your bladder.  You may develop or continue to have heartburn. This is caused by increased hormones that slow down muscles in the digestive tract.  You may develop or continue to have constipation because increased hormones slow digestion and cause the muscles that push waste through your intestines to relax.  You may develop hemorrhoids. These are swollen veins (varicose veins) in the rectum that can itch or be painful.  You may develop swollen, bulging veins (varicose veins) in your legs.  You may have increased body aches in the pelvis, back, or thighs. This is due to weight gain and increased hormones that are relaxing your joints.  You may have changes in your hair. These can include thickening of your hair, rapid growth, and changes in texture. Some women also have hair loss during or after pregnancy, or hair that feels dry or thin. Your hair will most likely return to normal after your baby is born.  Your breasts will continue to grow and they will continue to become tender. A yellow fluid (colostrum) may leak from your breasts. This is the first milk you are producing for your baby.  Your belly button may stick out.  You may notice more swelling in your hands,  face, or ankles.  You may have increased tingling or numbness in your hands, arms, and legs. The skin on your belly may also feel numb.  You may feel short of breath because of your expanding uterus.  You may have more problems sleeping. This can be caused by the size of your belly, increased need to urinate, and an increase in your body's metabolism.  You may notice the fetus "dropping," or moving lower in your abdomen (lightening).  You may have increased vaginal discharge.  You may notice your joints feel loose and you may have pain around your pelvic bone.  What to expect at prenatal visits You will have prenatal exams every 2 weeks until week 36. Then you will have weekly prenatal exams. During a routine prenatal visit:  You will be weighed to make sure you and the baby are growing normally.  Your blood pressure will be taken.  Your abdomen will be measured to track your baby's growth.  The fetal heartbeat will be listened to.  Any test results from the previous visit will be discussed.  You may have a cervical check near your due date to see if your cervix has softened or thinned (effaced).  You will be tested for Group B streptococcus. This happens between 35 and 37 weeks.  Your health care provider may ask you:  What your birth plan is.  How you are feeling.  If you are feeling the baby move.  If you have had   any abnormal symptoms, such as leaking fluid, bleeding, severe headaches, or abdominal cramping.  If you are using any tobacco products, including cigarettes, chewing tobacco, and electronic cigarettes.  If you have any questions.  Other tests or screenings that may be performed during your third trimester include:  Blood tests that check for low iron levels (anemia).  Fetal testing to check the health, activity level, and growth of the fetus. Testing is done if you have certain medical conditions or if there are problems during the  pregnancy.  Nonstress test (NST). This test checks the health of your baby to make sure there are no signs of problems, such as the baby not getting enough oxygen. During this test, a belt is placed around your belly. The baby is made to move, and its heart rate is monitored during movement.  What is false labor? False labor is a condition in which you feel small, irregular tightenings of the muscles in the womb (contractions) that usually go away with rest, changing position, or drinking water. These are called Braxton Hicks contractions. Contractions may last for hours, days, or even weeks before true labor sets in. If contractions come at regular intervals, become more frequent, increase in intensity, or become painful, you should see your health care provider. What are the signs of labor?  Abdominal cramps.  Regular contractions that start at 10 minutes apart and become stronger and more frequent with time.  Contractions that start on the top of the uterus and spread down to the lower abdomen and back.  Increased pelvic pressure and dull back pain.  A watery or bloody mucus discharge that comes from the vagina.  Leaking of amniotic fluid. This is also known as your "water breaking." It could be a slow trickle or a gush. Let your health care provider know if it has a color or strange odor. If you have any of these signs, call your health care provider right away, even if it is before your due date. Follow these instructions at home: Medicines  Follow your health care provider's instructions regarding medicine use. Specific medicines may be either safe or unsafe to take during pregnancy.  Take a prenatal vitamin that contains at least 600 micrograms (mcg) of folic acid.  If you develop constipation, try taking a stool softener if your health care provider approves. Eating and drinking  Eat a balanced diet that includes fresh fruits and vegetables, whole grains, good sources of protein  such as meat, eggs, or tofu, and low-fat dairy. Your health care provider will help you determine the amount of weight gain that is right for you.  Avoid raw meat and uncooked cheese. These carry germs that can cause birth defects in the baby.  If you have low calcium intake from food, talk to your health care provider about whether you should take a daily calcium supplement.  Eat four or five small meals rather than three large meals a day.  Limit foods that are high in fat and processed sugars, such as fried and sweet foods.  To prevent constipation: ? Drink enough fluid to keep your urine clear or pale yellow. ? Eat foods that are high in fiber, such as fresh fruits and vegetables, whole grains, and beans. Activity  Exercise only as directed by your health care provider. Most women can continue their usual exercise routine during pregnancy. Try to exercise for 30 minutes at least 5 days a week. Stop exercising if you experience uterine contractions.  Avoid heavy   lifting.  Do not exercise in extreme heat or humidity, or at high altitudes.  Wear low-heel, comfortable shoes.  Practice good posture.  You may continue to have sex unless your health care provider tells you otherwise. Relieving pain and discomfort  Take frequent breaks and rest with your legs elevated if you have leg cramps or low back pain.  Take warm sitz baths to soothe any pain or discomfort caused by hemorrhoids. Use hemorrhoid cream if your health care provider approves.  Wear a good support bra to prevent discomfort from breast tenderness.  If you develop varicose veins: ? Wear support pantyhose or compression stockings as told by your healthcare provider. ? Elevate your feet for 15 minutes, 3-4 times a day. Prenatal care  Write down your questions. Take them to your prenatal visits.  Keep all your prenatal visits as told by your health care provider. This is important. Safety  Wear your seat belt at  all times when driving.  Make a list of emergency phone numbers, including numbers for family, friends, the hospital, and police and fire departments. General instructions  Avoid cat litter boxes and soil used by cats. These carry germs that can cause birth defects in the baby. If you have a cat, ask someone to clean the litter box for you.  Do not travel far distances unless it is absolutely necessary and only with the approval of your health care provider.  Do not use hot tubs, steam rooms, or saunas.  Do not drink alcohol.  Do not use any products that contain nicotine or tobacco, such as cigarettes and e-cigarettes. If you need help quitting, ask your health care provider.  Do not use any medicinal herbs or unprescribed drugs. These chemicals affect the formation and growth of the baby.  Do not douche or use tampons or scented sanitary pads.  Do not cross your legs for long periods of time.  To prepare for the arrival of your baby: ? Take prenatal classes to understand, practice, and ask questions about labor and delivery. ? Make a trial run to the hospital. ? Visit the hospital and tour the maternity area. ? Arrange for maternity or paternity leave through employers. ? Arrange for family and friends to take care of pets while you are in the hospital. ? Purchase a rear-facing car seat and make sure you know how to install it in your car. ? Pack your hospital bag. ? Prepare the baby's nursery. Make sure to remove all pillows and stuffed animals from the baby's crib to prevent suffocation.  Visit your dentist if you have not gone during your pregnancy. Use a soft toothbrush to brush your teeth and be gentle when you floss. Contact a health care provider if:  You are unsure if you are in labor or if your water has broken.  You become dizzy.  You have mild pelvic cramps, pelvic pressure, or nagging pain in your abdominal area.  You have lower back pain.  You have persistent  nausea, vomiting, or diarrhea.  You have an unusual or bad smelling vaginal discharge.  You have pain when you urinate. Get help right away if:  Your water breaks before 37 weeks.  You have regular contractions less than 5 minutes apart before 37 weeks.  You have a fever.  You are leaking fluid from your vagina.  You have spotting or bleeding from your vagina.  You have severe abdominal pain or cramping.  You have rapid weight loss or weight gain.    You have shortness of breath with chest pain.  You notice sudden or extreme swelling of your face, hands, ankles, feet, or legs.  Your baby makes fewer than 10 movements in 2 hours.  You have severe headaches that do not go away when you take medicine.  You have vision changes. Summary  The third trimester is from week 28 through week 40, months 7 through 9. The third trimester is a time when the unborn baby (fetus) is growing rapidly.  During the third trimester, your discomfort may increase as you and your baby continue to gain weight. You may have abdominal, leg, and back pain, sleeping problems, and an increased need to urinate.  During the third trimester your breasts will keep growing and they will continue to become tender. A yellow fluid (colostrum) may leak from your breasts. This is the first milk you are producing for your baby.  False labor is a condition in which you feel small, irregular tightenings of the muscles in the womb (contractions) that eventually go away. These are called Braxton Hicks contractions. Contractions may last for hours, days, or even weeks before true labor sets in.  Signs of labor can include: abdominal cramps; regular contractions that start at 10 minutes apart and become stronger and more frequent with time; watery or bloody mucus discharge that comes from the vagina; increased pelvic pressure and dull back pain; and leaking of amniotic fluid. This information is not intended to replace advice  given to you by your health care provider. Make sure you discuss any questions you have with your health care provider. Document Released: 03/08/2001 Document Revised: 08/20/2015 Document Reviewed: 05/15/2012 [employer] Interactive Patient Education  2017 [employer]  

## 2017-10-17 ENCOUNTER — Ambulatory Visit (HOSPITAL_COMMUNITY)
Admission: RE | Admit: 2017-10-17 | Discharge: 2017-10-17 | Disposition: A | Discharge status: Home / Self Care | Payer: Managed Care, Other (non HMO) | Source: Ambulatory Visit | Attending: Certified Nurse Midwife | Admitting: Certified Nurse Midwife

## 2017-10-17 ENCOUNTER — Encounter (HOSPITAL_COMMUNITY): Payer: Self-pay

## 2017-10-17 ENCOUNTER — Other Ambulatory Visit (HOSPITAL_COMMUNITY): Payer: Self-pay | Admitting: Obstetrics and Gynecology

## 2017-10-17 DIAGNOSIS — Z3A28 28 weeks gestation of pregnancy: Secondary | ICD-10-CM

## 2017-10-17 DIAGNOSIS — Z362 Encounter for other antenatal screening follow-up: Secondary | ICD-10-CM

## 2017-10-17 DIAGNOSIS — O09299 Supervision of pregnancy with other poor reproductive or obstetric history, unspecified trimester: Secondary | ICD-10-CM | POA: Diagnosis not present

## 2017-10-17 DIAGNOSIS — Z3480 Encounter for supervision of other normal pregnancy, unspecified trimester: Secondary | ICD-10-CM

## 2017-10-17 DIAGNOSIS — O0933 Supervision of pregnancy with insufficient antenatal care, third trimester: Secondary | ICD-10-CM | POA: Diagnosis not present

## 2017-10-17 DIAGNOSIS — O99119 Other diseases of the blood and blood-forming organs and certain disorders involving the immune mechanism complicating pregnancy, unspecified trimester: Secondary | ICD-10-CM | POA: Diagnosis not present

## 2017-10-17 DIAGNOSIS — O99113 Other diseases of the blood and blood-forming organs and certain disorders involving the immune mechanism complicating pregnancy, third trimester: Secondary | ICD-10-CM | POA: Diagnosis not present

## 2017-10-17 DIAGNOSIS — D696 Thrombocytopenia, unspecified: Secondary | ICD-10-CM

## 2017-10-17 DIAGNOSIS — O99213 Obesity complicating pregnancy, third trimester: Secondary | ICD-10-CM | POA: Insufficient documentation

## 2017-10-17 DIAGNOSIS — O99212 Obesity complicating pregnancy, second trimester: Secondary | ICD-10-CM

## 2017-10-17 LAB — GLUCOSE TOLERANCE, 2 HOURS W/ 1HR
Glucose, 1 hour: 113 mg/dL (ref 65–179)
Glucose, 2 hour: 128 mg/dL (ref 65–152)
Glucose, Fasting: 73 mg/dL (ref 65–91)

## 2017-10-17 LAB — CBC
HEMATOCRIT: 37.3 % (ref 34.0–46.6)
HEMOGLOBIN: 12.1 g/dL (ref 11.1–15.9)
MCH: 33.2 pg — ABNORMAL HIGH (ref 26.6–33.0)
MCHC: 32.4 g/dL (ref 31.5–35.7)
MCV: 103 fL — ABNORMAL HIGH (ref 79–97)
Platelets: 131 10*3/uL — ABNORMAL LOW (ref 150–450)
RBC: 3.64 x10E6/uL — AB (ref 3.77–5.28)
RDW: 13.5 % (ref 12.3–15.4)
WBC: 12.5 10*3/uL — ABNORMAL HIGH (ref 3.4–10.8)

## 2017-10-17 LAB — HIV ANTIBODY (ROUTINE TESTING W REFLEX): HIV Screen 4th Generation wRfx: NONREACTIVE

## 2017-10-17 LAB — RPR: RPR: NONREACTIVE

## 2017-10-18 ENCOUNTER — Other Ambulatory Visit (HOSPITAL_COMMUNITY): Payer: Self-pay | Admitting: *Deleted

## 2017-10-18 DIAGNOSIS — O99210 Obesity complicating pregnancy, unspecified trimester: Secondary | ICD-10-CM

## 2017-10-31 ENCOUNTER — Encounter: Payer: Managed Care, Other (non HMO) | Admitting: Obstetrics

## 2017-11-01 ENCOUNTER — Other Ambulatory Visit: Payer: Self-pay

## 2017-11-01 ENCOUNTER — Inpatient Hospital Stay (HOSPITAL_COMMUNITY)
Admission: AD | Admit: 2017-11-01 | Discharge: 2017-11-01 | Disposition: A | Discharge status: Home / Self Care | Payer: Managed Care, Other (non HMO) | Source: Ambulatory Visit | Attending: Obstetrics & Gynecology | Admitting: Obstetrics & Gynecology

## 2017-11-01 ENCOUNTER — Encounter (HOSPITAL_COMMUNITY): Payer: Self-pay

## 2017-11-01 ENCOUNTER — Telehealth: Payer: Self-pay

## 2017-11-01 DIAGNOSIS — Z3A31 31 weeks gestation of pregnancy: Secondary | ICD-10-CM | POA: Diagnosis not present

## 2017-11-01 DIAGNOSIS — R1013 Epigastric pain: Secondary | ICD-10-CM | POA: Insufficient documentation

## 2017-11-01 DIAGNOSIS — O99513 Diseases of the respiratory system complicating pregnancy, third trimester: Secondary | ICD-10-CM | POA: Diagnosis not present

## 2017-11-01 DIAGNOSIS — Z7982 Long term (current) use of aspirin: Secondary | ICD-10-CM | POA: Insufficient documentation

## 2017-11-01 DIAGNOSIS — O99113 Other diseases of the blood and blood-forming organs and certain disorders involving the immune mechanism complicating pregnancy, third trimester: Secondary | ICD-10-CM

## 2017-11-01 DIAGNOSIS — H538 Other visual disturbances: Secondary | ICD-10-CM | POA: Insufficient documentation

## 2017-11-01 DIAGNOSIS — Z79899 Other long term (current) drug therapy: Secondary | ICD-10-CM | POA: Diagnosis not present

## 2017-11-01 DIAGNOSIS — F419 Anxiety disorder, unspecified: Secondary | ICD-10-CM | POA: Insufficient documentation

## 2017-11-01 DIAGNOSIS — R51 Headache: Secondary | ICD-10-CM | POA: Insufficient documentation

## 2017-11-01 DIAGNOSIS — J45909 Unspecified asthma, uncomplicated: Secondary | ICD-10-CM | POA: Insufficient documentation

## 2017-11-01 DIAGNOSIS — D696 Thrombocytopenia, unspecified: Secondary | ICD-10-CM | POA: Diagnosis not present

## 2017-11-01 DIAGNOSIS — O99119 Other diseases of the blood and blood-forming organs and certain disorders involving the immune mechanism complicating pregnancy, unspecified trimester: Secondary | ICD-10-CM

## 2017-11-01 DIAGNOSIS — O99343 Other mental disorders complicating pregnancy, third trimester: Secondary | ICD-10-CM | POA: Insufficient documentation

## 2017-11-01 DIAGNOSIS — G44201 Tension-type headache, unspecified, intractable: Secondary | ICD-10-CM

## 2017-11-01 DIAGNOSIS — F329 Major depressive disorder, single episode, unspecified: Secondary | ICD-10-CM | POA: Insufficient documentation

## 2017-11-01 DIAGNOSIS — R42 Dizziness and giddiness: Secondary | ICD-10-CM | POA: Insufficient documentation

## 2017-11-01 DIAGNOSIS — O26893 Other specified pregnancy related conditions, third trimester: Secondary | ICD-10-CM | POA: Diagnosis not present

## 2017-11-01 DIAGNOSIS — Z8249 Family history of ischemic heart disease and other diseases of the circulatory system: Secondary | ICD-10-CM | POA: Insufficient documentation

## 2017-11-01 LAB — COMPREHENSIVE METABOLIC PANEL
ALT: 18 U/L (ref 0–44)
AST: 18 U/L (ref 15–41)
Albumin: 3 g/dL — ABNORMAL LOW (ref 3.5–5.0)
Alkaline Phosphatase: 96 U/L (ref 38–126)
Anion gap: 10 (ref 5–15)
BUN: 10 mg/dL (ref 6–20)
CO2: 18 mmol/L — ABNORMAL LOW (ref 22–32)
Calcium: 8.3 mg/dL — ABNORMAL LOW (ref 8.9–10.3)
Chloride: 105 mmol/L (ref 98–111)
Creatinine, Ser: 0.43 mg/dL — ABNORMAL LOW (ref 0.44–1.00)
GFR calc Af Amer: >60 mL/min (ref >60)
GFR calc non Af Amer: >60 mL/min (ref >60)
Glucose, Bld: 87 mg/dL (ref 70–99)
Potassium: 4 mmol/L (ref 3.5–5.1)
Sodium: 133 mmol/L — ABNORMAL LOW (ref 135–145)
Total Bilirubin: 0.2 mg/dL — ABNORMAL LOW (ref 0.3–1.2)
Total Protein: 6.1 g/dL — ABNORMAL LOW (ref 6.5–8.1)

## 2017-11-01 LAB — CBC
HCT: 33.6 % — ABNORMAL LOW (ref 36.0–46.0)
Hemoglobin: 11.3 g/dL — ABNORMAL LOW (ref 12.0–15.0)
MCH: 32.9 pg (ref 26.0–34.0)
MCHC: 33.6 g/dL (ref 30.0–36.0)
MCV: 98 fL (ref 78.0–100.0)
PLATELETS: 133 10*3/uL — AB (ref 150–400)
RBC: 3.43 MIL/uL — ABNORMAL LOW (ref 3.87–5.11)
RDW: 13.7 % (ref 11.5–15.5)
WBC: 16.6 10*3/uL — ABNORMAL HIGH (ref 4.0–10.5)

## 2017-11-01 LAB — URINALYSIS, ROUTINE W REFLEX MICROSCOPIC
Bilirubin Urine: NEGATIVE
Glucose, UA: NEGATIVE mg/dL
Hgb urine dipstick: NEGATIVE
Ketones, ur: NEGATIVE mg/dL
Leukocytes, UA: NEGATIVE
Nitrite: NEGATIVE
Protein, ur: NEGATIVE mg/dL
Specific Gravity, Urine: 1.005 (ref 1.005–1.030)
pH: 7 (ref 5.0–8.0)

## 2017-11-01 LAB — PROTEIN / CREATININE RATIO, URINE
Creatinine, Urine: 24 mg/dL
Total Protein, Urine: <6 mg/dL

## 2017-11-01 MED ORDER — DIPHENHYDRAMINE HCL 50 MG/ML IJ SOLN
25.0000 mg | Freq: Once | INTRAMUSCULAR | Status: AC
  Administered 2017-11-01: 25 mg via INTRAVENOUS
  Filled 2017-11-01: qty 1

## 2017-11-01 MED ORDER — DEXAMETHASONE SODIUM PHOSPHATE 10 MG/ML IJ SOLN
10.0000 mg | Freq: Once | INTRAMUSCULAR | Status: AC
  Administered 2017-11-01: 10 mg via INTRAVENOUS
  Filled 2017-11-01: qty 1

## 2017-11-01 MED ORDER — LACTATED RINGERS IV BOLUS
1000.0000 mL | Freq: Once | INTRAVENOUS | Status: AC
  Administered 2017-11-01: 1000 mL via INTRAVENOUS

## 2017-11-01 MED ORDER — METOCLOPRAMIDE HCL 5 MG/ML IJ SOLN
10.0000 mg | Freq: Once | INTRAMUSCULAR | Status: AC
  Administered 2017-11-01: 10 mg via INTRAVENOUS
  Filled 2017-11-01: qty 2

## 2017-11-01 NOTE — MAU Provider Note (Signed)
Chief Complaint:  Dizziness and Headache   First Provider Initiated Contact with Patient 11/01/17 1326      HPI: Lauren Garcia is a 32 y.o. G4P3003 at 19w0dwho presents to maternity admissions reporting dizziness, epigastric pain, headache and blurred vision. She reports symptoms started this morning. She states the dizziness and blurred vision continued while at work and had to leave work due to feeling like she was going to pass out. She reports abdominal pain and headache started while she was at work. Reports upper abdominal pain in the epigastric region that radiates to her RUQ, rates pain 6/10 while at work but denies abdominal pain since being in MAU. She rates headache 7/10- has taken Tylenol 1000mg  for headache with no relief. She reports a hx of preeclampsia, denies BP issues during this pregnancy.  She reports good fetal movement, denies LOF, vaginal bleeding. Pt denies current symptoms of abdominal pain and blurred vision.  Past Medical History: Past Medical History:  Diagnosis Date  . Anxiety   . Asthma    "sinus induced"  . Depression   . Pregnancy induced hypertension    with first preg  . Sinus infection   . Thrombocytopenia complicating pregnancy (HCC)     Past obstetric history: OB History  Gravida Para Term Preterm AB Living  4 3 3  0 0 3  SAB TAB Ectopic Multiple Live Births  0 0 0 0 3    # Outcome Date GA Lbr Len/2nd Weight Sex Delivery Anes PTL Lv  4 Current           3 Term 11/03/11 [redacted]w[redacted]d 34:06 / 00:21 6 lb 15.5 oz (3.16 kg) F Vag-Spont Other, EPI  LIV  2 Term 2010 [redacted]w[redacted]d 01:00 7 lb 5 oz (3.317 kg) M Vag-Spont   LIV  1 Term 2007 [redacted]w[redacted]d 17:00 8 lb (3.629 kg) M Vag-Spont EPI  LIV    Past Surgical History: Past Surgical History:  Procedure Laterality Date  . SINUS EXPLORATION    . WISDOM TOOTH EXTRACTION      Family History: Family History  Problem Relation Age of Onset  . Diabetes Maternal Grandmother   . Hypertension Maternal Grandmother    . Cancer Maternal Grandmother   . COPD Maternal Grandmother   . Arthritis Maternal Grandmother   . Arthritis Mother   . Diabetes Paternal Grandmother   . Hypertension Paternal Grandmother   . Arthritis Paternal Grandmother   . Anesthesia problems Neg Hx   . Other Neg Hx     Social History: Social History   Tobacco Use  . Smoking status: Never Smoker  . Smokeless tobacco: Never Used  Substance Use Topics  . Alcohol use: No    Comment: rare  . Drug use: No    Allergies:  Allergies  Allergen Reactions  . Gluten Meal Other (See Comments)  . Lactose Intolerance (Gi) Diarrhea and Nausea And Vomiting    Meds:  Medications Prior to Admission  Medication Sig Dispense Refill Last Dose  . acetaminophen (TYLENOL) 500 MG tablet Take 1,000 mg by mouth every 6 (six) hours as needed for mild pain or headache.   11/01/2017 at Unknown time  . aspirin 81 MG chewable tablet Chew 1 tablet (81 mg total) by mouth daily. (Patient taking differently: Chew 81 mg by mouth at bedtime. ) 30 tablet 12 10/31/2017 at Unknown time  . cetirizine (ZYRTEC) 10 MG tablet Take 10 mg by mouth at bedtime.   10/31/2017 at Unknown time  . docusate sodium (  COLACE) 100 MG capsule Take 100 mg by mouth at bedtime as needed for mild constipation.   10/31/2017 at Unknown time  . fluticasone (FLONASE) 50 MCG/ACT nasal spray Place 1 spray into both nostrils at bedtime.   10/31/2017 at Unknown time  . Prenatal MV-Min-FA-Omega-3 (PRENATAL GUMMIES/DHA & FA) 0.4-32.5 MG CHEW Chew 2 each by mouth at bedtime.    10/31/2017 at Unknown time  . Elastic Bandages & Supports (COMFORT FIT MATERNITY SUPP LG) MISC 1 Units by Does not apply route daily. 1 each 0 Taking    ROS:  Review of Systems  Eyes: Positive for visual disturbance.       Blurred vision  Respiratory: Negative.   Cardiovascular: Negative.   Gastrointestinal: Positive for abdominal pain. Negative for constipation, diarrhea, nausea and vomiting.       Epigastric pain   Genitourinary: Negative.   Musculoskeletal: Negative.   Neurological: Positive for dizziness, syncope and headaches.   I have reviewed patient's Past Medical Hx, Surgical Hx, Family Hx, Social Hx, medications and allergies.   Physical Exam   Patient Vitals for the past 24 hrs:  BP Temp Temp src Pulse Resp Height Weight  11/01/17 1300 129/72 97.6 F (36.4 C) Oral (!) 109 18 - -  11/01/17 1254 - - - - - 5\' 6"  (1.676 m) 258 lb 11.2 oz (117.3 kg)   Constitutional: Well-developed, obese female in no acute distress.  Cardiovascular: normal rate Respiratory: normal effort GI: Abd soft, non-tender, gravid appropriate for gestational age.  MS: Extremities nontender, no edema, normal ROM Neurologic: Alert and oriented x 4.  GU: Neg CVAT. PELVIC EXAM: deferred     FHT:  Baseline 130 , moderate variability, accelerations present, no decelerations Contractions:none   Labs: Results for orders placed or performed during the hospital encounter of 11/01/17 (from the past 24 hour(s))  Urinalysis, Routine w reflex microscopic     Status: Abnormal   Collection Time: 11/01/17  1:14 PM  Result Value Ref Range   Color, Urine STRAW (A) YELLOW   APPearance CLEAR CLEAR   Specific Gravity, Urine 1.005 1.005 - 1.030   pH 7.0 5.0 - 8.0   Glucose, UA NEGATIVE NEGATIVE mg/dL   Hgb urine dipstick NEGATIVE NEGATIVE   Bilirubin Urine NEGATIVE NEGATIVE   Ketones, ur NEGATIVE NEGATIVE mg/dL   Protein, ur NEGATIVE NEGATIVE mg/dL   Nitrite NEGATIVE NEGATIVE   Leukocytes, UA NEGATIVE NEGATIVE  Protein / creatinine ratio, urine     Status: None   Collection Time: 11/01/17  1:14 PM  Result Value Ref Range   Creatinine, Urine 24.00 mg/dL   Total Protein, Urine <6 mg/dL   Protein Creatinine Ratio        0.00 - 0.15 mg/mg[Cre]  Comprehensive metabolic panel     Status: Abnormal   Collection Time: 11/01/17  1:59 PM  Result Value Ref Range   Sodium 133 (L) 135 - 145 mmol/L   Potassium 4.0 3.5 - 5.1 mmol/L    Chloride 105 98 - 111 mmol/L   CO2 18 (L) 22 - 32 mmol/L   Glucose, Bld 87 70 - 99 mg/dL   BUN 10 6 - 20 mg/dL   Creatinine, Ser 6.960.43 (L) 0.44 - 1.00 mg/dL   Calcium 8.3 (L) 8.9 - 10.3 mg/dL   Total Protein 6.1 (L) 6.5 - 8.1 g/dL   Albumin 3.0 (L) 3.5 - 5.0 g/dL   AST 18 15 - 41 U/L   ALT 18 0 - 44 U/L   Alkaline  Phosphatase 96 38 - 126 U/L   Total Bilirubin 0.2 (L) 0.3 - 1.2 mg/dL   GFR calc non Af Amer >60 >60 mL/min   GFR calc Af Amer >60 >60 mL/min   Anion gap 10 5 - 15  CBC     Status: Abnormal   Collection Time: 11/01/17  4:50 PM  Result Value Ref Range   WBC 16.6 (H) 4.0 - 10.5 K/uL   RBC 3.43 (L) 3.87 - 5.11 MIL/uL   Hemoglobin 11.3 (L) 12.0 - 15.0 g/dL   HCT 16.1 (L) 09.6 - 04.5 %   MCV 98.0 78.0 - 100.0 fL   MCH 32.9 26.0 - 34.0 pg   MCHC 33.6 30.0 - 36.0 g/dL   RDW 40.9 81.1 - 91.4 %   Platelets 133 (L) 150 - 400 K/uL   O/Negative/-- (03/20 1142)   MAU Course/MDM: Orders Placed This Encounter  Procedures  . Urinalysis, Routine w reflex microscopic  . CBC  . Comprehensive metabolic panel  . Protein / creatinine ratio, urine  . Insert peripheral IV    UA- negative  PEC labs- negative   Meds ordered this encounter  Medications  . AND Linked Order Group   . diphenhydrAMINE (BENADRYL) injection 25 mg   . metoCLOPramide (REGLAN) injection 10 mg   . dexamethasone (DECADRON) injection 10 mg  . lactated ringers bolus 1,000 mL   NST reviewed- reactive for gestational age  Treatments in MAU included HA cocktail including benadryl, reglan and decadron in LR IV bolus. Patient reports relief of HA with medication treatment. Pt denies dizziness or blurred vision after treatment.    Pt discharge with strict PEC precautions and reasons to return to MAU.  Assessment: 1. Acute intractable tension-type headache   2. Dizziness   3. Thrombocytopenia affecting pregnancy (HCC)     Plan: Discharge home Preterm Labor precautions and fetal kick counts Follow up as  scheduled for prenatal appointments  Return to MAU as needed    Allergies as of 11/01/2017      Reactions   Gluten Meal Other (See Comments)   Lactose Intolerance (gi) Diarrhea, Nausea And Vomiting      Medication List    TAKE these medications   acetaminophen 500 MG tablet Commonly known as:  TYLENOL Take 1,000 mg by mouth every 6 (six) hours as needed for mild pain or headache.   aspirin 81 MG chewable tablet Chew 1 tablet (81 mg total) by mouth daily. What changed:  when to take this   cetirizine 10 MG tablet Commonly known as:  ZYRTEC Take 10 mg by mouth at bedtime.   COMFORT FIT MATERNITY SUPP LG Misc 1 Units by Does not apply route daily.   docusate sodium 100 MG capsule Commonly known as:  COLACE Take 100 mg by mouth at bedtime as needed for mild constipation.   fluticasone 50 MCG/ACT nasal spray Commonly known as:  FLONASE Place 1 spray into both nostrils at bedtime.   PRENATAL GUMMIES/DHA & FA 0.4-32.5 MG Chew Chew 2 each by mouth at bedtime.       Steward Drone Certified Nurse-Midwife 11/01/2017 4:27PM

## 2017-11-01 NOTE — Progress Notes (Addendum)
G4P3 @ [redacted] wksga. Present to triage for dizziness, fatigue, nausea, and abd pain that all started this morning. Currently no more pain.   Denies LOF or bleeding  EFM applied  1346: pending urine results. Provider Suzette BattiestVeronica aware  1358: Ordered for PIH labs, IV with Ha cocktail. Awaiting orders.   1407: labs drawn. IV started with LR bolus. sunquest labeler not working. IT notified for room 5  1424: medicated per order.   1510 checked on patient. Woke pt up accidentally. Pt states the ha cocktail helped with her ha. LR credit 100. Provider made aware.   1615: pt has several questions. Provider Suzette BattiestVeronica made aware.   1622: pt okayed to d/c home.   1627: Discharge instructions given with pt understanding. Pt left unit via ambulatory with SO.

## 2017-11-01 NOTE — Telephone Encounter (Signed)
Pt called stating that she has been having some dizziness, feeling lightheaded, nausea, fatigue, and upper abdominal pain. Pt has been experiencing these sx since this am. She denies having any swelling, but is having some blurred vision. Pt advised to go to MAU for evaluation.

## 2017-11-06 ENCOUNTER — Ambulatory Visit (INDEPENDENT_AMBULATORY_CARE_PROVIDER_SITE_OTHER): Payer: Managed Care, Other (non HMO) | Admitting: Obstetrics

## 2017-11-06 ENCOUNTER — Encounter: Payer: Self-pay | Admitting: Obstetrics

## 2017-11-06 VITALS — BP 121/80 | HR 105 | Wt 259.3 lb

## 2017-11-06 DIAGNOSIS — D696 Thrombocytopenia, unspecified: Secondary | ICD-10-CM

## 2017-11-06 DIAGNOSIS — O99119 Other diseases of the blood and blood-forming organs and certain disorders involving the immune mechanism complicating pregnancy, unspecified trimester: Secondary | ICD-10-CM

## 2017-11-06 DIAGNOSIS — O99113 Other diseases of the blood and blood-forming organs and certain disorders involving the immune mechanism complicating pregnancy, third trimester: Secondary | ICD-10-CM

## 2017-11-06 DIAGNOSIS — Z3480 Encounter for supervision of other normal pregnancy, unspecified trimester: Secondary | ICD-10-CM

## 2017-11-06 DIAGNOSIS — O26813 Pregnancy related exhaustion and fatigue, third trimester: Secondary | ICD-10-CM

## 2017-11-06 NOTE — Patient Instructions (Signed)
Thrombocytopenia           Thrombocytopenia is a condition in which you have an abnormally decreased number of platelets in your blood. Platelets are also called thrombocytes. Platelets are needed for blood clotting. Some cases of thrombocytopenia are mild while others are more severe. What are the causes? This condition may be caused by:  Decreased production of platelets. This can be caused by: ? Aplastic anemia, in which your bone marrow quits making blood cells. ? Cancer in the bone marrow. ? Use of certain medicines, including chemotherapy. ? Infection in the bone marrow. ? Heavy alcohol consumption.  Increased destruction of platelets. This can be caused by: ? Certain immune diseases. ? Use of certain drugs. ? Certain blood clotting disorders. ? Certain inherited disorders. ? Certain bleeding disorders. ? Pregnancy.  Having an enlarged spleen (hypersplenism). In hypersplenism, the spleen gathers up platelets from circulation. This means that the platelets are not available to help with blood clotting. The spleen can be enlarged because of cirrhosis or other conditions.  What are the signs or symptoms? Symptoms of this condition are side effects of poor blood clotting. They will vary depending on how low the platelet counts are. Symptoms may include:  Abnormal bleeding.  Nosebleeds.  Heavy menstrual periods.  Blood in the urine or stool (feces).  A purplish discoloration in the skin (purpura).  Bruising.  A rash that looks like pinpoint, purplish-red spots (petechiae) on the skin and mucous membranes.  How is this diagnosed? This condition may be diagnosed with blood tests and a physical exam. Sometimes, a sample of bone marrow may be removed to look for the original cells (megakaryocytes) that make platelets. How is this treated? Treatment for this condition depends on the cause. Treatment options may include:  Treatment of another condition that is causing  the low platelet count.  Medicines to help protect your platelets from being destroyed.  A replacement (transfusion) of platelets to stop or prevent bleeding.  Surgery to remove the spleen.  Follow these instructions at home: General instructions  Check your skin and the linings inside your mouth for bruising or bleeding as told by your health care provider.  Check your sputum, urine, and stool for blood as told by your health care provider.  Ask your health care provider if it is okay for you to drink alcohol.  Take over-the-counter and prescription medicines only as told by your health care provider.  Tell all of your health care providers, including dentists and eye doctors, about your condition. Activity  Until your health care provider says it is okay. ? Do not return to any activities that could cause bumps or bruises.  Take extra care not to cut yourself when you shave or when you use scissors, needles, knives, and other tools.  Take extra care not to burn yourself when ironing or cooking. Contact a health care provider if:  You have unexplained bruising. Get help right away if:  You have active bleeding from anywhere on your body.  You have blood in your sputum, urine, or stool. This information is not intended to replace advice given to you by your health care provider. Make sure you discuss any questions you have with your health care provider. Document Released: 03/14/2005 Document Revised: 11/15/2015 Document Reviewed: 09/15/2014 Elsevier Interactive Patient Education  2018 [employer]  

## 2017-11-06 NOTE — Progress Notes (Signed)
Pt c/o feeling fatigued

## 2017-11-06 NOTE — Progress Notes (Addendum)
Subjective:  Lauren Garcia is a 32 y.o. G4P3003 at 2443w5d being seen today for ongoing prenatal care.  She is currently monitored for the following issues for this low-risk pregnancy and has Supervision of other normal pregnancy, antepartum; Morbidly obese (HCC); History of pre-eclampsia; Nausea/vomiting in pregnancy; Rh negative state in antepartum period; Thrombocytopenia affecting pregnancy (HCC); [redacted] weeks gestation of pregnancy; Obesity affecting pregnancy in second trimester; and Pregnancy with poor obstetric history on their problem list.  Patient reports fatigue.  Contractions: Irregular. Vag. Bleeding: None.  Movement: Present. Denies leaking of fluid.   The following portions of the patient's history were reviewed and updated as appropriate: allergies, current medications, past family history, past medical history, past social history, past surgical history and problem list. Problem list updated.  Objective:   Vitals:   11/06/17 0923  BP: 121/80  Pulse: (!) 105  Weight: 259 lb 4.8 oz (117.6 kg)    Fetal Status: Fetal Heart Rate (bpm): 150   Movement: Present     General:  Alert, oriented and cooperative. Patient is in no acute distress.  Skin: Skin is warm and dry. No rash noted.   Cardiovascular: Normal heart rate noted  Respiratory: Normal respiratory effort, no problems with respiration noted  Abdomen: Soft, gravid, appropriate for gestational age. Pain/Pressure: Absent     Pelvic:  Cervical exam deferred        Extremities: Normal range of motion.  Edema: None  Mental Status: Normal mood and affect. Normal behavior. Normal judgment and thought content.   Urinalysis:      Assessment and Plan:  Pregnancy: G4P3003 at 7643w5d  1. Supervision of other normal pregnancy, antepartum Rx: - Hepatitis C antibody  2. Thrombocytopenia affecting pregnancy, antepartum (HCC) Rx: - AMB referral to maternal fetal medicine  3. Pregnancy related fatigue in third trimester -  Iron ( Fusion Plus ) dispensed, #32, Take 1 po qhs   Preterm labor symptoms and general obstetric precautions including but not limited to vaginal bleeding, contractions, leaking of fluid and fetal movement were reviewed in detail with the patient. Please refer to After Visit Summary for other counseling recommendations.  Return in about 2 weeks (around 11/20/2017) for ROB.   Brock BadHarper, Kacy Conely A, MD

## 2017-11-07 ENCOUNTER — Encounter (HOSPITAL_COMMUNITY): Payer: Self-pay

## 2017-11-07 ENCOUNTER — Ambulatory Visit (HOSPITAL_COMMUNITY)
Admission: RE | Admit: 2017-11-07 | Discharge: 2017-11-07 | Disposition: A | Discharge status: Home / Self Care | Payer: Managed Care, Other (non HMO) | Source: Ambulatory Visit | Attending: Obstetrics | Admitting: Obstetrics

## 2017-11-07 DIAGNOSIS — Z7982 Long term (current) use of aspirin: Secondary | ICD-10-CM | POA: Diagnosis not present

## 2017-11-07 DIAGNOSIS — D696 Thrombocytopenia, unspecified: Secondary | ICD-10-CM

## 2017-11-07 DIAGNOSIS — O99113 Other diseases of the blood and blood-forming organs and certain disorders involving the immune mechanism complicating pregnancy, third trimester: Secondary | ICD-10-CM | POA: Diagnosis present

## 2017-11-07 DIAGNOSIS — D6959 Other secondary thrombocytopenia: Secondary | ICD-10-CM | POA: Diagnosis not present

## 2017-11-07 DIAGNOSIS — Z3A31 31 weeks gestation of pregnancy: Secondary | ICD-10-CM | POA: Diagnosis not present

## 2017-11-07 DIAGNOSIS — Z3480 Encounter for supervision of other normal pregnancy, unspecified trimester: Secondary | ICD-10-CM

## 2017-11-07 LAB — HEPATITIS C ANTIBODY

## 2017-11-07 NOTE — Consult Note (Signed)
CONSULT NOTE      MATERNAL FETAL MEDICINE CONSULT  Patient Name: Lauren BarrackSheilla K Myricks  Medical Record Number: [id number] Date of Birth: 1986-01-15  Requesting Physician Name: Coral Ceoharles Harper, M.D. Date of Service: 11/07/2017  I had the pleasure of seeing Ms. Abate [facility] at [facility].  She is G4, P3 at 31w 6d weeks gestation.  She is here for consultation because of thrombocytopenia.  Recent platelet count (11/01/2017) was 133 in the previous 1 (10/16/2017) was 131K.  Patient does not give history of bruising easily.  She had couple of episodes of epistaxis he attributes to dry climate.  She gives history of heavy menstrual periods.  In her previous 2 pregnancies platelet counts were low enthesis 106K and 112K)).  Past medical history: Patient reports she had hypertension in the beginning of her pregnancy and her blood pressures have been normal in the recent prenatal visits. Past surgical history: Sinus surgery, oral surgeries. Medications prenatal vitamins, aspirin, Flonase as needed, iron supplements stool softeners. Allergies: No known drug allergies. Social history: Denies tobacco drug or alcohol use.  She is been married 12 years her partner is in good health. Family history: Maternal grandmother had DVT. GYN history: Heavy menstrual periods.  No history of abnormal Pap smears vehicle surgeries. OB history: 1) Term vaginal delivery in 2007 of a female infant weighing 8 pounds at birth.  He reports she had induction of labor. 2) Term vaginal delivery of a female infant weighing 7 pounds 9 ounces.  She had induction of labor at 40 weeks. 3) Term vaginal delivery of a female infant weighing 6 pounds 5 ounces. Patient reports she had gestational hypertension in 2 of her pregnancies.  Prenatal care: Opted not to screen for fetal aneuploidies.  She does not have gestational diabetes.  I counseled the patient on the following: Gestational  thrombocytopenia: Based on the history and platelet counts she has gestational thrombocytopenia.  She does not have hypertension or preeclampsia.  Her platelet counts were never too low to the diagnosis of immune thrombocytopenia.   Thrombocytopenia counts for about 80% of thrombocytopenia is in pregnancies.  The lower limit of platelet count below which thrombocytopenia as is defined is not established and ranges between 115 to 123K.  I reassured the patient that gestational thrombocytopenia is not associated with any adverse maternal or fetal outcomes.  Regional anesthesia can be safely administered.  Likelihood of epidural hematoma is very low even if the platelet counts are below 90 K.  Most anesthesiologists would administer regional anesthesia of the platelet counts are above 70 K.  I also reassured her that the likelihood of neonatal thrombocytopenia born to women with gestational thrombocytopenia is very low (0.2-2%).  Gestational thrombocytopenia is more frequent of sequent pregnancies. Dexamethasone or prednisone treatment has been tried to improve platelet counts (if they fall below 70K) to facilitate regional anesthesia but that efficacy is not well proven.  Patient is taking low-dose aspirin. I do not expect any effect of aspirin (low-dose) on her platelet count. It may be continued till delivery.  I recommend checking platelet counts at 6-week postpartum visit.  Thank you for your consult.  If you have any questions or concerns please do not hesitate to contact me at the [facility].  Consultation including face-to-face counseling 40 minutes.

## 2017-11-20 ENCOUNTER — Ambulatory Visit (INDEPENDENT_AMBULATORY_CARE_PROVIDER_SITE_OTHER): Payer: Managed Care, Other (non HMO) | Admitting: Obstetrics

## 2017-11-20 ENCOUNTER — Encounter: Payer: Self-pay | Admitting: Obstetrics

## 2017-11-20 VITALS — BP 121/84 | HR 103 | Wt 261.2 lb

## 2017-11-20 DIAGNOSIS — D696 Thrombocytopenia, unspecified: Secondary | ICD-10-CM

## 2017-11-20 DIAGNOSIS — O99119 Other diseases of the blood and blood-forming organs and certain disorders involving the immune mechanism complicating pregnancy, unspecified trimester: Secondary | ICD-10-CM

## 2017-11-20 DIAGNOSIS — Z8759 Personal history of other complications of pregnancy, childbirth and the puerperium: Secondary | ICD-10-CM

## 2017-11-20 DIAGNOSIS — Z3483 Encounter for supervision of other normal pregnancy, third trimester: Secondary | ICD-10-CM

## 2017-11-20 DIAGNOSIS — Z3480 Encounter for supervision of other normal pregnancy, unspecified trimester: Secondary | ICD-10-CM

## 2017-11-20 DIAGNOSIS — O99113 Other diseases of the blood and blood-forming organs and certain disorders involving the immune mechanism complicating pregnancy, third trimester: Secondary | ICD-10-CM

## 2017-11-20 DIAGNOSIS — O99212 Obesity complicating pregnancy, second trimester: Secondary | ICD-10-CM

## 2017-11-20 DIAGNOSIS — O99213 Obesity complicating pregnancy, third trimester: Secondary | ICD-10-CM

## 2017-11-20 NOTE — Progress Notes (Signed)
Subjective:  Lauren Garcia is a 32 y.o. G4P3003 at 2426w5d being seen today for ongoing prenatal care.  She is currently monitored for the following issues for this high-risk pregnancy and has Supervision of other normal pregnancy, antepartum; Morbidly obese (HCC); History of pre-eclampsia; Nausea/vomiting in pregnancy; Rh negative state in antepartum period; Thrombocytopenia affecting pregnancy (HCC); [redacted] weeks gestation of pregnancy; Obesity affecting pregnancy in second trimester; and Pregnancy with poor obstetric history on their problem list.  Patient reports backache, fatigue, heartburn and occasional contractions.  Contractions: Irregular. Vag. Bleeding: None.  Movement: Present. Denies leaking of fluid.   The following portions of the patient's history were reviewed and updated as appropriate: allergies, current medications, past family history, past medical history, past social history, past surgical history and problem list. Problem list updated.  Objective:   Vitals:   11/20/17 0923  BP: 121/84  Pulse: (!) 103  Weight: 261 lb 3.2 oz (118.5 kg)    Fetal Status: Fetal Heart Rate (bpm): 150   Movement: Present     General:  Alert, oriented and cooperative. Patient is in no acute distress.  Skin: Skin is warm and dry. No rash noted.   Cardiovascular: Normal heart rate noted  Respiratory: Normal respiratory effort, no problems with respiration noted  Abdomen: Soft, gravid, appropriate for gestational age. Pain/Pressure: Present     Pelvic:  Cervical exam deferred        Extremities: Normal range of motion.  Edema: None  Mental Status: Normal mood and affect. Normal behavior. Normal judgment and thought content.   Urinalysis:      Assessment and Plan:  Pregnancy: G4P3003 at 226w5d  1. Supervision of other normal pregnancy, antepartum  2. Thrombocytopenia affecting pregnancy, antepartum (HCC) - check platelets at 36 weeks.  They have been stable at ~130K.  3. History  of pre-eclampsia - Baby ASA Rx  4. Obesity affecting pregnancy in second trimester   Preterm labor symptoms and general obstetric precautions including but not limited to vaginal bleeding, contractions, leaking of fluid and fetal movement were reviewed in detail with the patient. Please refer to After Visit Summary for other counseling recommendations.  Return in about 2 weeks (around 12/04/2017) for ROB.   Brock BadHarper, Mariacristina Aday A, MD

## 2017-11-20 NOTE — Progress Notes (Signed)
Pt states that she had some bleeding last week on Tuesday that lasted for a couple of hours.

## 2017-11-23 ENCOUNTER — Telehealth: Payer: Self-pay

## 2017-11-23 NOTE — Telephone Encounter (Signed)
TC to pt regarding message  C/o pain when walking stated :baby is on nerve" Pt not ava left detailed message for pt to call back.

## 2017-11-24 ENCOUNTER — Telehealth: Payer: Self-pay

## 2017-11-24 NOTE — Telephone Encounter (Signed)
Second RTC to pt regarding message again pt not ava I left detailed info in pt voice mail.

## 2017-12-04 ENCOUNTER — Other Ambulatory Visit: Payer: Self-pay

## 2017-12-04 ENCOUNTER — Ambulatory Visit (INDEPENDENT_AMBULATORY_CARE_PROVIDER_SITE_OTHER): Payer: Managed Care, Other (non HMO) | Admitting: Obstetrics

## 2017-12-04 ENCOUNTER — Encounter: Payer: Self-pay | Admitting: Obstetrics

## 2017-12-04 DIAGNOSIS — Z3480 Encounter for supervision of other normal pregnancy, unspecified trimester: Secondary | ICD-10-CM

## 2017-12-04 DIAGNOSIS — Z3483 Encounter for supervision of other normal pregnancy, third trimester: Secondary | ICD-10-CM

## 2017-12-04 NOTE — Progress Notes (Signed)
ROB.  C/o swollen feet, irregular contractions 30+ minutes apart x 1 week. It is very hot at her job and she has missed 4 days of work. Pt missed days of work on 8/30, 8/31, and 9/7 due to bloody show, foot swelling, and migraines. Dr Clearance Coots asked work excuse note be written and given to pt.

## 2017-12-04 NOTE — Progress Notes (Signed)
Subjective:  Lauren Garcia is a 32 y.o. G4P3003 at [redacted]w[redacted]d being seen today for ongoing prenatal care.  She is currently monitored for the following issues for this high-risk pregnancy and has Supervision of other normal pregnancy, antepartum; Morbidly obese (HCC); History of pre-eclampsia; Nausea/vomiting in pregnancy; Rh negative state in antepartum period; Thrombocytopenia affecting pregnancy (HCC); [redacted] weeks gestation of pregnancy; Obesity affecting pregnancy in second trimester; and Pregnancy with poor obstetric history on their problem list.  Patient reports occasional contractions and swollen feet.  Contractions: Irregular. Vag. Bleeding: None.  Movement: Present. Denies leaking of fluid.   The following portions of the patient's history were reviewed and updated as appropriate: allergies, current medications, past family history, past medical history, past social history, past surgical history and problem list. Problem list updated.  Objective:   Vitals:   12/04/17 0914  BP: 128/80  Pulse: (!) 103  Weight: 266 lb 12.8 oz (121 kg)    Fetal Status: Fetal Heart Rate (bpm): 150   Movement: Present     General:  Alert, oriented and cooperative. Patient is in no acute distress.  Skin: Skin is warm and dry. No rash noted.   Cardiovascular: Normal heart rate noted  Respiratory: Normal respiratory effort, no problems with respiration noted  Abdomen: Soft, gravid, appropriate for gestational age. Pain/Pressure: Present     Pelvic:  Cervical exam deferred        Extremities: Normal range of motion.  Edema: Trace  Mental Status: Normal mood and affect. Normal behavior. Normal judgment and thought content.   Urinalysis:      Assessment and Plan:  Pregnancy: G4P3003 at [redacted]w[redacted]d  1. Supervision of other normal pregnancy, antepartum   Preterm labor symptoms and general obstetric precautions including but not limited to vaginal bleeding, contractions, leaking of fluid and fetal  movement were reviewed in detail with the patient. Please refer to After Visit Summary for other counseling recommendations.  Return in about 1 week (around 12/11/2017) for ROB.   Brock Bad, MD

## 2017-12-05 ENCOUNTER — Ambulatory Visit (HOSPITAL_COMMUNITY)
Admission: RE | Admit: 2017-12-05 | Discharge: 2017-12-05 | Disposition: A | Discharge status: Home / Self Care | Payer: Managed Care, Other (non HMO) | Source: Ambulatory Visit | Attending: Obstetrics | Admitting: Obstetrics

## 2017-12-05 ENCOUNTER — Encounter (HOSPITAL_COMMUNITY): Payer: Self-pay

## 2017-12-05 DIAGNOSIS — O99213 Obesity complicating pregnancy, third trimester: Secondary | ICD-10-CM | POA: Diagnosis not present

## 2017-12-05 DIAGNOSIS — D696 Thrombocytopenia, unspecified: Secondary | ICD-10-CM | POA: Diagnosis not present

## 2017-12-05 DIAGNOSIS — O09212 Supervision of pregnancy with history of pre-term labor, second trimester: Secondary | ICD-10-CM

## 2017-12-05 DIAGNOSIS — Z3480 Encounter for supervision of other normal pregnancy, unspecified trimester: Secondary | ICD-10-CM

## 2017-12-05 DIAGNOSIS — O99113 Other diseases of the blood and blood-forming organs and certain disorders involving the immune mechanism complicating pregnancy, third trimester: Secondary | ICD-10-CM | POA: Insufficient documentation

## 2017-12-05 DIAGNOSIS — E669 Obesity, unspecified: Secondary | ICD-10-CM | POA: Insufficient documentation

## 2017-12-05 DIAGNOSIS — O09293 Supervision of pregnancy with other poor reproductive or obstetric history, third trimester: Secondary | ICD-10-CM

## 2017-12-05 DIAGNOSIS — Z3A35 35 weeks gestation of pregnancy: Secondary | ICD-10-CM

## 2017-12-05 DIAGNOSIS — O99210 Obesity complicating pregnancy, unspecified trimester: Secondary | ICD-10-CM

## 2017-12-07 ENCOUNTER — Encounter (HOSPITAL_COMMUNITY): Payer: Self-pay | Admitting: *Deleted

## 2017-12-07 ENCOUNTER — Telehealth: Payer: Self-pay

## 2017-12-07 ENCOUNTER — Inpatient Hospital Stay (HOSPITAL_COMMUNITY)
Admission: AD | Admit: 2017-12-07 | Discharge: 2017-12-07 | Disposition: A | Discharge status: Home / Self Care | Payer: Managed Care, Other (non HMO) | Source: Ambulatory Visit | Attending: Obstetrics & Gynecology | Admitting: Obstetrics & Gynecology

## 2017-12-07 ENCOUNTER — Other Ambulatory Visit: Payer: Self-pay

## 2017-12-07 DIAGNOSIS — O26893 Other specified pregnancy related conditions, third trimester: Secondary | ICD-10-CM | POA: Diagnosis present

## 2017-12-07 DIAGNOSIS — J45909 Unspecified asthma, uncomplicated: Secondary | ICD-10-CM | POA: Diagnosis not present

## 2017-12-07 DIAGNOSIS — O99343 Other mental disorders complicating pregnancy, third trimester: Secondary | ICD-10-CM | POA: Insufficient documentation

## 2017-12-07 DIAGNOSIS — Z3A36 36 weeks gestation of pregnancy: Secondary | ICD-10-CM | POA: Diagnosis not present

## 2017-12-07 DIAGNOSIS — F329 Major depressive disorder, single episode, unspecified: Secondary | ICD-10-CM | POA: Insufficient documentation

## 2017-12-07 DIAGNOSIS — O479 False labor, unspecified: Secondary | ICD-10-CM

## 2017-12-07 DIAGNOSIS — F419 Anxiety disorder, unspecified: Secondary | ICD-10-CM | POA: Diagnosis not present

## 2017-12-07 DIAGNOSIS — Y92410 Unspecified street and highway as the place of occurrence of the external cause: Secondary | ICD-10-CM | POA: Insufficient documentation

## 2017-12-07 DIAGNOSIS — S3992XA Unspecified injury of lower back, initial encounter: Secondary | ICD-10-CM | POA: Diagnosis not present

## 2017-12-07 DIAGNOSIS — O99513 Diseases of the respiratory system complicating pregnancy, third trimester: Secondary | ICD-10-CM | POA: Insufficient documentation

## 2017-12-07 DIAGNOSIS — Z3480 Encounter for supervision of other normal pregnancy, unspecified trimester: Secondary | ICD-10-CM

## 2017-12-07 DIAGNOSIS — Z7982 Long term (current) use of aspirin: Secondary | ICD-10-CM | POA: Insufficient documentation

## 2017-12-07 DIAGNOSIS — M549 Dorsalgia, unspecified: Secondary | ICD-10-CM | POA: Insufficient documentation

## 2017-12-07 DIAGNOSIS — O9A213 Injury, poisoning and certain other consequences of external causes complicating pregnancy, third trimester: Secondary | ICD-10-CM | POA: Insufficient documentation

## 2017-12-07 DIAGNOSIS — Z79899 Other long term (current) drug therapy: Secondary | ICD-10-CM | POA: Diagnosis not present

## 2017-12-07 LAB — URINALYSIS, ROUTINE W REFLEX MICROSCOPIC
Bilirubin Urine: NEGATIVE
GLUCOSE, UA: NEGATIVE mg/dL
Hgb urine dipstick: NEGATIVE
Ketones, ur: NEGATIVE mg/dL
Leukocytes, UA: NEGATIVE
Nitrite: NEGATIVE
PH: 5 (ref 5.0–8.0)
PROTEIN: NEGATIVE mg/dL
Specific Gravity, Urine: 1.024 (ref 1.005–1.030)

## 2017-12-07 NOTE — Discharge Instructions (Signed)
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
## 2017-12-12 ENCOUNTER — Encounter: Payer: Self-pay | Admitting: Obstetrics

## 2017-12-12 ENCOUNTER — Ambulatory Visit (INDEPENDENT_AMBULATORY_CARE_PROVIDER_SITE_OTHER): Payer: Managed Care, Other (non HMO) | Admitting: Obstetrics

## 2017-12-12 VITALS — BP 133/86 | HR 99 | Wt 268.3 lb

## 2017-12-12 DIAGNOSIS — O99210 Obesity complicating pregnancy, unspecified trimester: Secondary | ICD-10-CM

## 2017-12-12 DIAGNOSIS — Z6791 Unspecified blood type, Rh negative: Secondary | ICD-10-CM

## 2017-12-12 DIAGNOSIS — D696 Thrombocytopenia, unspecified: Secondary | ICD-10-CM

## 2017-12-12 DIAGNOSIS — Z3483 Encounter for supervision of other normal pregnancy, third trimester: Secondary | ICD-10-CM | POA: Diagnosis not present

## 2017-12-12 DIAGNOSIS — Z3480 Encounter for supervision of other normal pregnancy, unspecified trimester: Secondary | ICD-10-CM

## 2017-12-12 DIAGNOSIS — Z113 Encounter for screening for infections with a predominantly sexual mode of transmission: Secondary | ICD-10-CM

## 2017-12-12 DIAGNOSIS — R21 Rash and other nonspecific skin eruption: Secondary | ICD-10-CM

## 2017-12-12 DIAGNOSIS — N898 Other specified noninflammatory disorders of vagina: Secondary | ICD-10-CM

## 2017-12-12 DIAGNOSIS — O26893 Other specified pregnancy related conditions, third trimester: Secondary | ICD-10-CM

## 2017-12-12 DIAGNOSIS — O26899 Other specified pregnancy related conditions, unspecified trimester: Secondary | ICD-10-CM

## 2017-12-12 DIAGNOSIS — O99213 Obesity complicating pregnancy, third trimester: Secondary | ICD-10-CM

## 2017-12-12 DIAGNOSIS — O99119 Other diseases of the blood and blood-forming organs and certain disorders involving the immune mechanism complicating pregnancy, unspecified trimester: Secondary | ICD-10-CM

## 2017-12-12 DIAGNOSIS — Z8759 Personal history of other complications of pregnancy, childbirth and the puerperium: Secondary | ICD-10-CM

## 2017-12-12 DIAGNOSIS — O99113 Other diseases of the blood and blood-forming organs and certain disorders involving the immune mechanism complicating pregnancy, third trimester: Secondary | ICD-10-CM

## 2017-12-12 MED ORDER — TRIAMCINOLONE ACETONIDE 0.5 % EX OINT: 1.0000 "application " | TOPICAL_OINTMENT | Freq: Two times a day (BID) | CUTANEOUS | 0 refills | Status: DC

## 2017-12-12 NOTE — Progress Notes (Signed)
Subjective:  Lauren Garcia is a 32 y.o. G4P3003 at 3577w6d being seen today for ongoing prenatal care.  She is currently monitored for the following issues for this low-risk pregnancy and has Supervision of other normal pregnancy, antepartum; Morbidly obese (HCC); History of pre-eclampsia; Nausea/vomiting in pregnancy; Rh negative state in antepartum period; Thrombocytopenia affecting pregnancy (HCC); [redacted] weeks gestation of pregnancy; Obesity affecting pregnancy in second trimester; and Pregnancy with poor obstetric history on their problem list.  Patient reports no complaints and rash on chest.  Contractions: Irregular. Vag. Bleeding: None, Scant.  Movement: Present. Denies leaking of fluid.   The following portions of the patient's history were reviewed and updated as appropriate: allergies, current medications, past family history, past medical history, past social history, past surgical history and problem list. Problem list updated.  Objective:   Vitals:   12/12/17 0913  BP: 133/86  Pulse: 99  Weight: 268 lb 4.8 oz (121.7 kg)    Fetal Status: Fetal Heart Rate (bpm): 150   Movement: Present     General:  Alert, oriented and cooperative. Patient is in no acute distress.  Skin: Skin is warm and dry. No rash noted.   Cardiovascular: Normal heart rate noted  Respiratory: Normal respiratory effort, no problems with respiration noted  Abdomen: Soft, gravid, appropriate for gestational age. Pain/Pressure: Present     Pelvic:  Cervical exam deferred        Extremities: Normal range of motion.  Edema: Mild pitting, slight indentation  Mental Status: Normal mood and affect. Normal behavior. Normal judgment and thought content.   Urinalysis:      Assessment and Plan:  Pregnancy: G4P3003 at 4477w6d  1. Supervision of other normal pregnancy, antepartum Rx: - Cervicovaginal ancillary only - Strep Gp B NAA  2. Thrombocytopenia affecting pregnancy, antepartum (HCC) - stable  3.  History of pre-eclampsia - taking Baby ASA  4. Rh negative state in antepartum period - Rhogam postpartum  5. Obesity affecting pregnancy, antepartum  6. Rash and nonspecific skin eruption Rx: - triamcinolone ointment (KENALOG) 0.5 %; Apply 1 application topically 2 (two) times daily.  Dispense: 30 g; Refill: 0  Preterm labor symptoms and general obstetric precautions including but not limited to vaginal bleeding, contractions, leaking of fluid and fetal movement were reviewed in detail with the patient. Please refer to After Visit Summary for other counseling recommendations.  Return in about 1 week (around 12/19/2017) for ROB.   Brock BadHarper, Keyla Milone A, MD

## 2017-12-12 NOTE — Progress Notes (Signed)
ROB/GBS.  C/o tiredness.

## 2017-12-12 NOTE — Addendum Note (Signed)
Addended by: Coral CeoHARPER, CHARLES A on: 12/12/2017 11:17 AM   Modules accepted: Orders

## 2017-12-13 ENCOUNTER — Other Ambulatory Visit: Payer: Managed Care, Other (non HMO)

## 2017-12-13 LAB — CERVICOVAGINAL ANCILLARY ONLY
BACTERIAL VAGINITIS: NEGATIVE
CANDIDA VAGINITIS: NEGATIVE
CHLAMYDIA, DNA PROBE: NEGATIVE
Neisseria Gonorrhea: NEGATIVE
Trichomonas: POSITIVE — AB

## 2017-12-13 NOTE — Addendum Note (Signed)
Addended by: Hilma FavorsALSTON, Katelyn Broadnax A on: 12/13/2017 09:51 AM   Modules accepted: Orders

## 2017-12-14 ENCOUNTER — Other Ambulatory Visit: Payer: Self-pay | Admitting: Obstetrics

## 2017-12-14 DIAGNOSIS — A5901 Trichomonal vulvovaginitis: Secondary | ICD-10-CM

## 2017-12-14 LAB — CBC
Hematocrit: 34.9 % (ref 34.0–46.6)
Hemoglobin: 11.6 g/dL (ref 11.1–15.9)
MCH: 31.4 pg (ref 26.6–33.0)
MCHC: 33.2 g/dL (ref 31.5–35.7)
MCV: 94 fL (ref 79–97)
PLATELETS: 130 10*3/uL — AB (ref 150–450)
RBC: 3.7 x10E6/uL — AB (ref 3.77–5.28)
RDW: 13.1 % (ref 12.3–15.4)
WBC: 10.6 10*3/uL (ref 3.4–10.8)

## 2017-12-14 LAB — STREP GP B NAA: Strep Gp B NAA: POSITIVE — AB

## 2017-12-14 MED ORDER — METRONIDAZOLE 500 MG PO TABS: 2000.0000 mg | ORAL_TABLET | Freq: Once | ORAL | 0 refills | Status: AC

## 2017-12-20 ENCOUNTER — Ambulatory Visit (INDEPENDENT_AMBULATORY_CARE_PROVIDER_SITE_OTHER): Payer: Managed Care, Other (non HMO) | Admitting: Obstetrics

## 2017-12-20 ENCOUNTER — Encounter: Payer: Self-pay | Admitting: Obstetrics

## 2017-12-20 VITALS — BP 114/76 | HR 95 | Wt 266.0 lb

## 2017-12-20 DIAGNOSIS — Z3480 Encounter for supervision of other normal pregnancy, unspecified trimester: Secondary | ICD-10-CM

## 2017-12-20 DIAGNOSIS — O99113 Other diseases of the blood and blood-forming organs and certain disorders involving the immune mechanism complicating pregnancy, third trimester: Secondary | ICD-10-CM

## 2017-12-20 DIAGNOSIS — D696 Thrombocytopenia, unspecified: Secondary | ICD-10-CM

## 2017-12-20 DIAGNOSIS — A5901 Trichomonal vulvovaginitis: Secondary | ICD-10-CM

## 2017-12-20 DIAGNOSIS — Z113 Encounter for screening for infections with a predominantly sexual mode of transmission: Secondary | ICD-10-CM

## 2017-12-20 DIAGNOSIS — Z3483 Encounter for supervision of other normal pregnancy, third trimester: Secondary | ICD-10-CM

## 2017-12-20 DIAGNOSIS — N898 Other specified noninflammatory disorders of vagina: Secondary | ICD-10-CM

## 2017-12-20 DIAGNOSIS — O99119 Other diseases of the blood and blood-forming organs and certain disorders involving the immune mechanism complicating pregnancy, unspecified trimester: Secondary | ICD-10-CM

## 2017-12-20 NOTE — Progress Notes (Signed)
ROB w/ complaints of contractions last night.pt request cervix check.  Pt wants Retest for Ivery Quale states she has not been sexually active.

## 2017-12-20 NOTE — Progress Notes (Signed)
Subjective:  Lauren Garcia is a 32 y.o. G4P3003 at [redacted]w[redacted]d being seen today for ongoing prenatal care.  She is currently monitored for the following issues for this high-risk pregnancy and has Supervision of other normal pregnancy, antepartum; Morbidly obese (HCC); History of pre-eclampsia; Nausea/vomiting in pregnancy; Rh negative state in antepartum period; Thrombocytopenia affecting pregnancy (HCC); [redacted] weeks gestation of pregnancy; Obesity affecting pregnancy in second trimester; and Pregnancy with poor obstetric history on their problem list.  Patient reports occasional contractions.  Contractions: Irregular. Vag. Bleeding: None.  Movement: Present. Denies leaking of fluid.   The following portions of the patient's history were reviewed and updated as appropriate: allergies, current medications, past family history, past medical history, past social history, past surgical history and problem list. Problem list updated.  Objective:   Vitals:   12/20/17 1043  BP: (!) 151/97  Pulse: 95  Weight: 266 lb (120.7 kg)    Fetal Status:     Movement: Present     General:  Alert, oriented and cooperative. Patient is in no acute distress.  Skin: Skin is warm and dry. No rash noted.   Cardiovascular: Normal heart rate noted  Respiratory: Normal respiratory effort, no problems with respiration noted  Abdomen: Soft, gravid, appropriate for gestational age. Pain/Pressure: Present     Pelvic:  Cvx:  2cm / 70% / -2 / Vtx  Extremities: Normal range of motion.  Edema: Trace  Mental Status: Normal mood and affect. Normal behavior. Normal judgment and thought content.   Urinalysis:      Assessment and Plan:  Pregnancy: G4P3003 at [redacted]w[redacted]d  1. Supervision of other normal pregnancy, antepartum  2. Thrombocytopenia affecting pregnancy, antepartum (HCC) - platelets stable at ~ 130,000  3. Trichomonas vaginitis - treated - Cervicovaginal ancillary only   Term labor symptoms and general  obstetric precautions including but not limited to vaginal bleeding, contractions, leaking of fluid and fetal movement were reviewed in detail with the patient. Please refer to After Visit Summary for other counseling recommendations.  Return in about 1 week (around 12/27/2017) for ROB.   Brock Bad, MD

## 2017-12-22 LAB — CERVICOVAGINAL ANCILLARY ONLY
BACTERIAL VAGINITIS: NEGATIVE
CANDIDA VAGINITIS: NEGATIVE
Chlamydia: NEGATIVE
NEISSERIA GONORRHEA: NEGATIVE
TRICH (WINDOWPATH): NEGATIVE

## 2017-12-25 ENCOUNTER — Encounter (HOSPITAL_COMMUNITY): Payer: Self-pay

## 2017-12-25 ENCOUNTER — Inpatient Hospital Stay (HOSPITAL_COMMUNITY)
Admission: AD | Admit: 2017-12-25 | Discharge: 2017-12-26 | Disposition: A | Discharge status: Home / Self Care | Payer: Managed Care, Other (non HMO) | Source: Ambulatory Visit | Attending: Obstetrics and Gynecology | Admitting: Obstetrics and Gynecology

## 2017-12-25 DIAGNOSIS — D6959 Other secondary thrombocytopenia: Secondary | ICD-10-CM

## 2017-12-25 DIAGNOSIS — Z3A38 38 weeks gestation of pregnancy: Secondary | ICD-10-CM | POA: Insufficient documentation

## 2017-12-25 DIAGNOSIS — O99113 Other diseases of the blood and blood-forming organs and certain disorders involving the immune mechanism complicating pregnancy, third trimester: Secondary | ICD-10-CM

## 2017-12-25 DIAGNOSIS — O479 False labor, unspecified: Secondary | ICD-10-CM

## 2017-12-25 NOTE — MAU Note (Signed)
CTX since 1850, now 5 minutes apart.  No LOF/VB.  + FM.  Reports no complications with this pregnancy.  Hx of pre e.

## 2017-12-26 DIAGNOSIS — O133 Gestational [pregnancy-induced] hypertension without significant proteinuria, third trimester: Secondary | ICD-10-CM | POA: Diagnosis not present

## 2017-12-26 DIAGNOSIS — O134 Gestational [pregnancy-induced] hypertension without significant proteinuria, complicating childbirth: Secondary | ICD-10-CM | POA: Diagnosis not present

## 2017-12-26 DIAGNOSIS — Z3A39 39 weeks gestation of pregnancy: Secondary | ICD-10-CM | POA: Diagnosis not present

## 2017-12-26 NOTE — MAU Provider Note (Signed)
RN Labor Eval   Patient is a 31yo [id number] at [redacted]w[redacted]d who presented with complaint of contractions about every 5 minutes. Pregnancy complicated by history of preeclampsia, Rh (-), gestational thrombocytopenia. BP wnl today. SVE unchanged from 2.5/70/-2 after about 2 hours. Reactive CST 120s  mod +a  -d  irregular ctx. Discharged home with term labor precautions.   Lauren Garcia. Earlene Plater, DO OB/GYN Fellow

## 2017-12-26 NOTE — MAU Note (Signed)
I have communicated with Dr. Earlene Plater and reviewed vital signs:  Vitals:   12/25/17 2256  BP: 112/73  Pulse: 98  Resp: 19  Temp: 97.9 F (36.6 C)  SpO2: 99%    Vaginal exam:  Dilation: 2.5 Effacement (%): 70 Cervical Position: Middle Station: -2 Presentation: Vertex Exam by:: Latricia Heft, RN,   Also reviewed contraction pattern and that non-stress test is reactive.  It has been documented that patient is contracting every 5-7 minutes with no cervical change over 1 hour not indicating active labor.  Patient denies any other complaints.  Based on this report provider has given order for discharge.  A discharge order and diagnosis entered by a provider.   Labor discharge instructions reviewed with patient.

## 2017-12-28 ENCOUNTER — Encounter: Payer: Self-pay | Admitting: Obstetrics

## 2017-12-28 ENCOUNTER — Ambulatory Visit: Payer: Managed Care, Other (non HMO) | Admitting: Obstetrics

## 2017-12-28 ENCOUNTER — Encounter (HOSPITAL_COMMUNITY): Payer: Self-pay | Admitting: *Deleted

## 2017-12-28 ENCOUNTER — Inpatient Hospital Stay (HOSPITAL_COMMUNITY)
Admission: AD | Admit: 2017-12-28 | Discharge: 2017-12-30 | DRG: 806 | Disposition: A | Discharge status: Home / Self Care | Payer: Managed Care, Other (non HMO) | Attending: Obstetrics & Gynecology | Admitting: Obstetrics & Gynecology

## 2017-12-28 VITALS — BP 122/82 | HR 95 | Wt 272.0 lb

## 2017-12-28 DIAGNOSIS — O134 Gestational [pregnancy-induced] hypertension without significant proteinuria, complicating childbirth: Principal | ICD-10-CM | POA: Diagnosis present

## 2017-12-28 DIAGNOSIS — Z6791 Unspecified blood type, Rh negative: Secondary | ICD-10-CM

## 2017-12-28 DIAGNOSIS — O26899 Other specified pregnancy related conditions, unspecified trimester: Secondary | ICD-10-CM

## 2017-12-28 DIAGNOSIS — Z8759 Personal history of other complications of pregnancy, childbirth and the puerperium: Secondary | ICD-10-CM

## 2017-12-28 DIAGNOSIS — O3663X0 Maternal care for excessive fetal growth, third trimester, not applicable or unspecified: Secondary | ICD-10-CM

## 2017-12-28 DIAGNOSIS — O99213 Obesity complicating pregnancy, third trimester: Secondary | ICD-10-CM

## 2017-12-28 DIAGNOSIS — O133 Gestational [pregnancy-induced] hypertension without significant proteinuria, third trimester: Secondary | ICD-10-CM | POA: Diagnosis not present

## 2017-12-28 DIAGNOSIS — O99113 Other diseases of the blood and blood-forming organs and certain disorders involving the immune mechanism complicating pregnancy, third trimester: Secondary | ICD-10-CM

## 2017-12-28 DIAGNOSIS — Z7982 Long term (current) use of aspirin: Secondary | ICD-10-CM | POA: Diagnosis not present

## 2017-12-28 DIAGNOSIS — D696 Thrombocytopenia, unspecified: Secondary | ICD-10-CM

## 2017-12-28 DIAGNOSIS — Z23 Encounter for immunization: Secondary | ICD-10-CM | POA: Diagnosis not present

## 2017-12-28 DIAGNOSIS — E669 Obesity, unspecified: Secondary | ICD-10-CM | POA: Diagnosis present

## 2017-12-28 DIAGNOSIS — O9912 Other diseases of the blood and blood-forming organs and certain disorders involving the immune mechanism complicating childbirth: Secondary | ICD-10-CM | POA: Diagnosis present

## 2017-12-28 DIAGNOSIS — O99824 Streptococcus B carrier state complicating childbirth: Secondary | ICD-10-CM | POA: Diagnosis present

## 2017-12-28 DIAGNOSIS — O99119 Other diseases of the blood and blood-forming organs and certain disorders involving the immune mechanism complicating pregnancy, unspecified trimester: Secondary | ICD-10-CM

## 2017-12-28 DIAGNOSIS — O99214 Obesity complicating childbirth: Secondary | ICD-10-CM | POA: Diagnosis present

## 2017-12-28 DIAGNOSIS — O26893 Other specified pregnancy related conditions, third trimester: Secondary | ICD-10-CM | POA: Diagnosis present

## 2017-12-28 DIAGNOSIS — Z3A39 39 weeks gestation of pregnancy: Secondary | ICD-10-CM | POA: Diagnosis not present

## 2017-12-28 DIAGNOSIS — O0993 Supervision of high risk pregnancy, unspecified, third trimester: Secondary | ICD-10-CM

## 2017-12-28 DIAGNOSIS — O99210 Obesity complicating pregnancy, unspecified trimester: Secondary | ICD-10-CM

## 2017-12-28 DIAGNOSIS — O0990 Supervision of high risk pregnancy, unspecified, unspecified trimester: Secondary | ICD-10-CM

## 2017-12-28 DIAGNOSIS — B951 Streptococcus, group B, as the cause of diseases classified elsewhere: Secondary | ICD-10-CM

## 2017-12-28 DIAGNOSIS — Z3483 Encounter for supervision of other normal pregnancy, third trimester: Secondary | ICD-10-CM

## 2017-12-28 LAB — CBC
HCT: 33.4 % — ABNORMAL LOW (ref 36.0–46.0)
Hemoglobin: 11.3 g/dL — ABNORMAL LOW (ref 12.0–15.0)
MCH: 32.7 pg (ref 26.0–34.0)
MCHC: 33.8 g/dL (ref 30.0–36.0)
MCV: 96.5 fL (ref 78.0–100.0)
Platelets: 134 10*3/uL — ABNORMAL LOW (ref 150–400)
RBC: 3.46 MIL/uL — ABNORMAL LOW (ref 3.87–5.11)
RDW: 13.8 % (ref 11.5–15.5)
WBC: 11.7 10*3/uL — ABNORMAL HIGH (ref 4.0–10.5)

## 2017-12-28 LAB — COMPREHENSIVE METABOLIC PANEL WITH GFR
ALT: 18 U/L (ref 0–44)
AST: 19 U/L (ref 15–41)
Albumin: 3 g/dL — ABNORMAL LOW (ref 3.5–5.0)
Alkaline Phosphatase: 162 U/L — ABNORMAL HIGH (ref 38–126)
Anion gap: 11 (ref 5–15)
BUN: 11 mg/dL (ref 6–20)
CO2: 18 mmol/L — ABNORMAL LOW (ref 22–32)
Calcium: 8.8 mg/dL — ABNORMAL LOW (ref 8.9–10.3)
Chloride: 106 mmol/L (ref 98–111)
Creatinine, Ser: 0.56 mg/dL (ref 0.44–1.00)
GFR calc Af Amer: >60 mL/min
GFR calc non Af Amer: >60 mL/min
Glucose, Bld: 73 mg/dL (ref 70–99)
Potassium: 4.3 mmol/L (ref 3.5–5.1)
Sodium: 135 mmol/L (ref 135–145)
Total Bilirubin: 0.7 mg/dL (ref 0.3–1.2)
Total Protein: 6.1 g/dL — ABNORMAL LOW (ref 6.5–8.1)

## 2017-12-28 LAB — TYPE AND SCREEN
ABO/RH(D): O NEG
ANTIBODY SCREEN: NEGATIVE

## 2017-12-28 LAB — PROTEIN / CREATININE RATIO, URINE
Creatinine, Urine: 32 mg/dL
Total Protein, Urine: <6 mg/dL

## 2017-12-28 MED ORDER — SODIUM CHLORIDE 0.9 % IV SOLN
5.0000 10*6.[IU] | Freq: Once | INTRAVENOUS | Status: AC
  Administered 2017-12-28: 5 10*6.[IU] via INTRAVENOUS
  Filled 2017-12-28: qty 5

## 2017-12-28 MED ORDER — ACETAMINOPHEN 500 MG PO TABS
1000.0000 mg | ORAL_TABLET | Freq: Once | ORAL | Status: AC
  Administered 2017-12-28: 1000 mg via ORAL
  Filled 2017-12-28: qty 2

## 2017-12-28 MED ORDER — OXYTOCIN 40 UNITS IN LACTATED RINGERS INFUSION - SIMPLE MED
2.5000 [IU]/h | INTRAVENOUS | Status: DC
  Filled 2017-12-28: qty 1000

## 2017-12-28 MED ORDER — OXYCODONE-ACETAMINOPHEN 5-325 MG PO TABS: 1.0000 | ORAL_TABLET | ORAL | Status: DC | PRN

## 2017-12-28 MED ORDER — OXYCODONE-ACETAMINOPHEN 5-325 MG PO TABS: 2.0000 | ORAL_TABLET | ORAL | Status: DC | PRN

## 2017-12-28 MED ORDER — ACETAMINOPHEN 325 MG PO TABS: 650.0000 mg | ORAL_TABLET | ORAL | Status: DC | PRN

## 2017-12-28 MED ORDER — PENICILLIN G 3 MILLION UNITS IVPB - SIMPLE MED
3.0000 10*6.[IU] | INTRAVENOUS | Status: DC
  Administered 2017-12-28 – 2017-12-29 (×3): 3 10*6.[IU] via INTRAVENOUS
  Filled 2017-12-28 (×5): qty 100

## 2017-12-28 MED ORDER — OXYTOCIN 40 UNITS IN LACTATED RINGERS INFUSION - SIMPLE MED
1.0000 m[IU]/min | INTRAVENOUS | Status: DC
  Administered 2017-12-28: 2 m[IU]/min via INTRAVENOUS

## 2017-12-28 MED ORDER — LACTATED RINGERS IV SOLN
INTRAVENOUS | Status: DC
  Administered 2017-12-28 (×2): via INTRAVENOUS

## 2017-12-28 MED ORDER — FLEET ENEMA 7-19 GM/118ML RE ENEM: 1.0000 | ENEMA | RECTAL | Status: DC | PRN

## 2017-12-28 MED ORDER — LIDOCAINE HCL (PF) 1 % IJ SOLN
30.0000 mL | INTRAMUSCULAR | Status: DC | PRN
  Filled 2017-12-28: qty 30

## 2017-12-28 MED ORDER — LACTATED RINGERS IV SOLN: 500.0000 mL | INTRAVENOUS | Status: DC | PRN

## 2017-12-28 MED ORDER — SOD CITRATE-CITRIC ACID 500-334 MG/5ML PO SOLN
30.0000 mL | ORAL | Status: DC | PRN
  Administered 2017-12-29: 30 mL via ORAL
  Filled 2017-12-28: qty 15

## 2017-12-28 MED ORDER — ONDANSETRON HCL 4 MG/2ML IJ SOLN: 4.0000 mg | Freq: Four times a day (QID) | INTRAMUSCULAR | Status: DC | PRN

## 2017-12-28 MED ORDER — OXYTOCIN BOLUS FROM INFUSION
500.0000 mL | Freq: Once | INTRAVENOUS | Status: AC
  Administered 2017-12-29: 500 mL via INTRAVENOUS

## 2017-12-28 MED ORDER — TERBUTALINE SULFATE 1 MG/ML IJ SOLN
0.2500 mg | Freq: Once | INTRAMUSCULAR | Status: DC | PRN
  Filled 2017-12-28: qty 1

## 2017-12-28 NOTE — Progress Notes (Signed)
ROB.  C/o headaches 5-6/10, light headedness and auroras x 1 week.

## 2017-12-28 NOTE — MAU Provider Note (Signed)
Chief Complaint  Patient presents with  . Headache     First Provider Initiated Contact with Patient 12/28/17 1159      S: ESME FREUND  is a 32 y.o. y.o. year old G58P3003 female at [redacted]w[redacted]d weeks gestation who presents to MAU with elevated blood pressures. Hx of preeclampsia in previous pregnancy. Current blood pressure medication: none, was taking baby ASA.  Had elevated BP in office last week & elevated BP in office today. In addition, she complains of headache & blurred vision so was sent by Dr. Clearance Coots for evaluation.   Associated symptoms: + Headache, + vision changes, denies epigastric pain Contractions: not painful Vaginal bleeding: denies Fetal movement: normal  O:  Patient Vitals for the past 24 hrs:  BP Pulse SpO2  12/28/17 1345 121/88 92 99 %  12/28/17 1330 121/82 93 100 %  12/28/17 1315 126/79 84 100 %  12/28/17 1300 122/74 88 99 %  12/28/17 1245 129/70 98 99 %  12/28/17 1233 133/78 93 -  12/28/17 1200 126/87 90 99 %  12/28/17 1145 131/78 93 99 %   General: NAD Heart: Regular rate Lungs: Normal rate and effort Abd: Soft, NT, Gravid, S=D Extremities: 2+ pitting Pedal edema Neuro: 2+ deep tendon reflexes, No clonus   Dilation: 2.5 Effacement (%): 80 Cervical Position: Posterior Station: -2 Presentation: Vertex Exam by:: Leafy Ro, RNC  NST:  Baseline: 120 bpm, Variability: Good {> 6 bpm), Accelerations: Reactive and Decelerations: Absent  Results for orders placed or performed during the hospital encounter of 12/28/17 (from the past 24 hour(s))  Protein / creatinine ratio, urine     Status: None   Collection Time: 12/28/17 11:24 AM  Result Value Ref Range   Creatinine, Urine 32.00 mg/dL   Total Protein, Urine <6 mg/dL   Protein Creatinine Ratio        0.00 - 0.15 mg/mg[Cre]  CBC     Status: Abnormal   Collection Time: 12/28/17 12:09 PM  Result Value Ref Range   WBC 11.7 (H) 4.0 - 10.5 K/uL   RBC 3.46 (L) 3.87 - 5.11 MIL/uL   Hemoglobin  11.3 (L) 12.0 - 15.0 g/dL   HCT 40.9 (L) 81.1 - 91.4 %   MCV 96.5 78.0 - 100.0 fL   MCH 32.7 26.0 - 34.0 pg   MCHC 33.8 30.0 - 36.0 g/dL   RDW 78.2 95.6 - 21.3 %   Platelets 134 (L) 150 - 400 K/uL  Comprehensive metabolic panel     Status: Abnormal   Collection Time: 12/28/17 12:09 PM  Result Value Ref Range   Sodium 135 135 - 145 mmol/L   Potassium 4.3 3.5 - 5.1 mmol/L   Chloride 106 98 - 111 mmol/L   CO2 18 (L) 22 - 32 mmol/L   Glucose, Bld 73 70 - 99 mg/dL   BUN 11 6 - 20 mg/dL   Creatinine, Ser 0.86 0.44 - 1.00 mg/dL   Calcium 8.8 (L) 8.9 - 10.3 mg/dL   Total Protein 6.1 (L) 6.5 - 8.1 g/dL   Albumin 3.0 (L) 3.5 - 5.0 g/dL   AST 19 15 - 41 U/L   ALT 18 0 - 44 U/L   Alkaline Phosphatase 162 (H) 38 - 126 U/L   Total Bilirubin 0.7 0.3 - 1.2 mg/dL   GFR calc non Af Amer >60 >60 mL/min   GFR calc Af Amer >60 >60 mL/min   Anion gap 11 5 - 15    A/P:  1. Gestational hypertension  without significant proteinuria during pregnancy in third trimester, antepartum  -pt normotensive in MAU with normal PEC labs but persistent h/a & blurred vision even after medication -C/w Dr. Shawnie Pons. Reviewed labs, BPs, h/a, & prenatal history. Will admit for IOL d/t GHTN -Care turned over to labor team  2. [redacted] weeks gestation of pregnancy   3. Positive GBS test  -penicillin protocol      Judeth Horn, NP 12/28/2017 2:04 PM

## 2017-12-28 NOTE — MAU Note (Signed)
Pt sent over from [facility], has history of thrombocytopenia and pre-e. Presented today to office with headache for the last few days.

## 2017-12-28 NOTE — Progress Notes (Addendum)
   Lauren Garcia is a 32 y.o. G4P3003 at [redacted]w[redacted]d  admitted for gestational hypertension.   Subjective: Ambulating in the room, no complaints. No complaints of HA, blurry vision, epigastric pain.   Objective: Vitals:   12/28/17 1609 12/28/17 1639 12/28/17 1710 12/28/17 1742  BP: (!) 132/91 (!) 144/94 136/80 132/83  Pulse: (!) 104 96 92 92  Resp: 16 16 15 15   Temp:      TempSrc:      SpO2:      Weight:      Height:       No intake/output data recorded.  FHT:  FHR: 120 bpm, variability: moderate,  accelerations:  Present,  decelerations:  Absent UC:   irregular, every 4 minutes SVE:   Dilation: 3 Effacement (%): 80 Station: -2 Exam by:: Dr. Annia Friendly Pitocin @ 8 mu/min  Labs: Lab Results  Component Value Date   WBC 11.7 (H) 12/28/2017   HGB 11.3 (L) 12/28/2017   HCT 33.4 (L) 12/28/2017   MCV 96.5 12/28/2017   PLT 134 (L) 12/28/2017    Assessment / Plan: early labor; on pitocin. Once cervix is 5-6 will consider AROM.  Pressures mildly elevated but none in severe range.   Labor: early labor Fetal Wellbeing:  Category I Pain Control:  Labor support without medications Anticipated MOD:  NSVD  Lauren Garcia 12/28/2017, 6:09 PM

## 2017-12-28 NOTE — Anesthesia Pain Management Evaluation Note (Signed)
  CRNA Pain Management Visit Note  Patient: Lauren Garcia, 32 y.o., female  "Hello I am a member of the anesthesia team at [facility]. We have an anesthesia team available at all times to provide care throughout the hospital, including epidural management and anesthesia for C-section. I don't know your plan for the delivery whether it a natural birth, water birth, IV sedation, nitrous supplementation, doula or epidural, but we want to meet your pain goals."   1.Was your pain managed to your expectations on prior hospitalizations?   Yes   2.What is your expectation for pain management during this hospitalization?     Epidural  3.How can we help you reach that goal? Be available  Record the patient's initial score and the patient's pain goal.   Pain: 4  Pain Goal: 8 [facility] wants you to be able to say your pain was always managed very well.  Edison Pace 12/28/2017

## 2017-12-28 NOTE — Progress Notes (Signed)
Subjective:  Lauren Garcia is a 32 y.o. G4P3003 at [redacted]w[redacted]d being seen today for ongoing prenatal care.  She is currently monitored for the following issues for this high-risk pregnancy and has Supervision of other normal pregnancy, antepartum; Morbidly obese (HCC); History of pre-eclampsia; Nausea/vomiting in pregnancy; Rh negative state in antepartum period; Thrombocytopenia affecting pregnancy (HCC); [redacted] weeks gestation of pregnancy; Obesity affecting pregnancy in second trimester; and Pregnancy with poor obstetric history on their problem list.  Patient reports headache.  Contractions: Irritability. Vag. Bleeding: None.  Movement: Present. Denies leaking of fluid.   The following portions of the patient's history were reviewed and updated as appropriate: allergies, current medications, past family history, past medical history, past social history, past surgical history and problem list. Problem list updated.  Objective:   Vitals:   12/28/17 1017 12/28/17 1024  BP: (!) 141/85 122/82  Pulse: (!) 111 95  Weight: 272 lb (123.4 kg)     Fetal Status: Fetal Heart Rate (bpm): 150   Movement: Present     General:  Alert, oriented and cooperative. Patient is in no acute distress.  Skin: Skin is warm and dry. No rash noted.   Cardiovascular: Normal heart rate noted  Respiratory: Normal respiratory effort, no problems with respiration noted  Abdomen: Soft, gravid, appropriate for gestational age. Pain/Pressure: Present     Pelvic:  Cervical exam deferred        Extremities: Normal range of motion.  Edema: Mild pitting, slight indentation  Mental Status: Normal mood and affect. Normal behavior. Normal judgment and thought content.   Urinalysis:      Assessment and Plan:  Pregnancy: G4P3003 at [redacted]w[redacted]d  1. Supervision of high risk pregnancy, antepartum  2. Thrombocytopenia affecting pregnancy (HCC) Rx: - CBC  3. History of pre-eclampsia - BP's stable, but she has had HA's with  visual changes over the past week  4. Obesity affecting pregnancy, antepartum  5. Excessive fetal growth affecting management of pregnancy in third trimester, single or unspecified fetus - > 90th percentile growth at 35 weeks  6. Rh negative state in antepartum period - Rhogam postpartum  Term labor symptoms and general obstetric precautions including but not limited to vaginal bleeding, contractions, leaking of fluid and fetal movement were reviewed in detail with the patient. Please refer to After Visit Summary for other counseling recommendations.  Patient sent to [facility] for further evaluation  Return in about 1 week (around 01/04/2018) for ROB.   Brock Bad, MD

## 2017-12-28 NOTE — Progress Notes (Signed)
Vitals:   12/28/17 2035 12/28/17 2132  BP: 127/79 123/81  Pulse: 88 89  Resp: 18 16  Temp:    SpO2:     Not having many ctx.  Pitocin at 12 mu/min. Cx 3-4/80/-2. FHR Cat 1.  Will continue increasing pitocin

## 2017-12-28 NOTE — H&P (Addendum)
LABOR AND DELIVERY ADMISSION HISTORY AND PHYSICAL NOTE  Lauren Garcia is a 32 y.o. female [phone] with IUP at [redacted]w[redacted]d by LMP presenting for IOL for gestational hypertension. She was sent to the MAU for further evaluation, after noted to have a BP of 141/85 during her prenatal visit this morning. She also endorses intermittent headaches and some blurry vision for the past few weeks. She currently has a pulsing posterior headache, given tylenol earlier with some relief.   She reports positive fetal movement. She denies major leakage of fluid or vaginal bleeding. Does note a small amount of fluid last week, but has not noticed any since.   Prenatal History/Complications: PNC at [facility].   Pregnancy complications: History of pre-eclampsia, Rh negative (received RhoGAM at 28 weeks), chronic thrombocytopenia, and gestational hypertension.    Past Medical History: Past Medical History:  Diagnosis Date  . Anxiety   . Asthma    "sinus induced"  . Depression   . Pregnancy induced hypertension    with first preg  . Sinus infection   . Thrombocytopenia complicating pregnancy [facility])     Past Surgical History: Past Surgical History:  Procedure Laterality Date  . SINUS EXPLORATION    . WISDOM TOOTH EXTRACTION      Obstetrical History: OB History    Gravida  4   Para  3   Term  3   Preterm  0   AB  0   Living  3     SAB  0   TAB  0   Ectopic  0   Multiple  0   Live Births  3           Social History: Social History   Socioeconomic History  . Marital status: Married    Spouse name: Not on file  . Number of children: Not on file  . Years of education: Not on file  . Highest education level: Not on file  Occupational History  . Not on file  Social Needs  . Financial resource strain: Not on file  . Food insecurity:    Worry: Not on file    Inability: Not on file  . Transportation needs:    Medical: Not on file    Non-medical: Not on file  Tobacco  Use  . Smoking status: Never Smoker  . Smokeless tobacco: Never Used  Substance and Sexual Activity  . Alcohol use: No    Comment: rare  . Drug use: No  . Sexual activity: Yes    Partners: Male    Birth control/protection: None  Lifestyle  . Physical activity:    Days per week: Not on file    Minutes per session: Not on file  . Stress: Not on file  Relationships  . Social connections:    Talks on phone: Not on file    Gets together: Not on file    Attends religious service: Not on file    Active member of club or organization: Not on file    Attends meetings of clubs or organizations: Not on file    Relationship status: Not on file  Other Topics Concern  . Not on file  Social History Narrative  . Not on file    Family History: Family History  Problem Relation Age of Onset  . Diabetes Maternal Grandmother   . Hypertension Maternal Grandmother   . Cancer Maternal Grandmother   . COPD Maternal Grandmother   . Arthritis Maternal Grandmother   . Arthritis  Mother   . Diabetes Paternal Grandmother   . Hypertension Paternal Grandmother   . Arthritis Paternal Grandmother   . Anesthesia problems Neg Hx   . Other Neg Hx     Allergies: Allergies  Allergen Reactions  . Gluten Meal Other (See Comments)  . Lactose Intolerance (Gi) Diarrhea and Nausea And Vomiting    Medications Prior to Admission  Medication Sig Dispense Refill Last Dose  . acetaminophen (TYLENOL) 500 MG tablet Take 1,000 mg by mouth every 6 (six) hours as needed for mild pain or headache.   12/28/2017 at Unknown time  . aspirin 81 MG chewable tablet Chew 1 tablet (81 mg total) by mouth daily. (Patient taking differently: Chew 81 mg by mouth at bedtime. ) 30 tablet 12 12/27/2017 at Unknown time  . cetirizine (ZYRTEC) 10 MG tablet Take 10 mg by mouth at bedtime.   12/27/2017 at Unknown time  . docusate sodium (COLACE) 100 MG capsule Take 100 mg by mouth at bedtime as needed for mild constipation.   12/27/2017 at  Unknown time  . fluticasone (FLONASE) 50 MCG/ACT nasal spray Place 1 spray into both nostrils daily.    12/28/2017 at Unknown time  . Prenatal MV-Min-FA-Omega-3 (PRENATAL GUMMIES/DHA & FA) 0.4-32.5 MG CHEW Chew 2 each by mouth at bedtime.    12/27/2017 at Unknown time  . Elastic Bandages & Supports (COMFORT FIT MATERNITY SUPP LG) MISC 1 Units by Does not apply route daily. 1 each 0 Taking  . triamcinolone ointment (KENALOG) 0.5 % Apply 1 application topically 2 (two) times daily. (Patient not taking: Reported on 12/28/2017) 30 g 0 Not Taking at Unknown time     Review of Systems  All systems reviewed and negative except as stated in HPI  Physical Exam Blood pressure (!) 132/91, pulse (!) 104, temperature 97.7 F (36.5 C), temperature source Oral, resp. rate 16, height 5\' 6"  (1.676 m), weight 123.4 kg, last menstrual period 03/29/2017, SpO2 99 %. General appearance: alert, oriented, NAD Lungs: normal respiratory effort Heart: regular rate Abdomen: soft, non-tender; gravid, FH appropriate for GA Extremities: No calf swelling or tenderness Presentation: cephalic Fetal monitoring: Baseline 120. Moderate variability. + accel, - decels  Uterine activity: Irritable, irregular  Dilation: 3 Effacement (%): 80 Station: -2 Exam by:: Dr. Annia Friendly  Prenatal labs: ABO, Rh: --/--/O NEG (10/03 1411) Antibody: NEG (10/03 1411) Rubella: 1.00 (03/20 1142) RPR: Non Reactive (07/22 1024)  HBsAg: Negative (03/20 1142)  HIV: Non Reactive (07/22 1024)  GC/Chlamydia: Negative (9/27)  GBS: Positive (09/17 0950)  2-hr GTT: Normal  Genetic screening:  Declined  Anatomy US: normal  Prenatal Transfer Tool  Maternal Diabetes: No Genetic Screening: Declined Maternal Ultrasounds/Referrals: Normal Fetal Ultrasounds or other Referrals:  None Maternal Substance Abuse:  No Significant Maternal Medications:  None Significant Maternal Lab Results: Lab values include: Group B Strep positive, Rh negative  Results  for orders placed or performed during the hospital encounter of 12/28/17 (from the past 24 hour(s))  Protein / creatinine ratio, urine   Collection Time: 12/28/17 11:24 AM  Result Value Ref Range   Creatinine, Urine 32.00 mg/dL   Total Protein, Urine <6 mg/dL   Protein Creatinine Ratio        0.00 - 0.15 mg/mg[Cre]  CBC   Collection Time: 12/28/17 12:09 PM  Result Value Ref Range   WBC 11.7 (H) 4.0 - 10.5 K/uL   RBC 3.46 (L) 3.87 - 5.11 MIL/uL   Hemoglobin 11.3 (L) 12.0 - 15.0 g/dL   HCT  33.4 (L) 36.0 - 46.0 %   MCV 96.5 78.0 - 100.0 fL   MCH 32.7 26.0 - 34.0 pg   MCHC 33.8 30.0 - 36.0 g/dL   RDW 16.1 09.6 - 04.5 %   Platelets 134 (L) 150 - 400 K/uL  Comprehensive metabolic panel   Collection Time: 12/28/17 12:09 PM  Result Value Ref Range   Sodium 135 135 - 145 mmol/L   Potassium 4.3 3.5 - 5.1 mmol/L   Chloride 106 98 - 111 mmol/L   CO2 18 (L) 22 - 32 mmol/L   Glucose, Bld 73 70 - 99 mg/dL   BUN 11 6 - 20 mg/dL   Creatinine, Ser 4.09 0.44 - 1.00 mg/dL   Calcium 8.8 (L) 8.9 - 10.3 mg/dL   Total Protein 6.1 (L) 6.5 - 8.1 g/dL   Albumin 3.0 (L) 3.5 - 5.0 g/dL   AST 19 15 - 41 U/L   ALT 18 0 - 44 U/L   Alkaline Phosphatase 162 (H) 38 - 126 U/L   Total Bilirubin 0.7 0.3 - 1.2 mg/dL   GFR calc non Af Amer >60 >60 mL/min   GFR calc Af Amer >60 >60 mL/min   Anion gap 11 5 - 15  Type and screen [facility]   Collection Time: 12/28/17  2:11 PM  Result Value Ref Range   ABO/RH(D) O NEG    Antibody Screen NEG    Sample Expiration      12/31/2017 Performed at [facility], [address], [place], [place] [address]     Patient Active Problem List   Diagnosis Date Noted  . Gestational hypertension w/o significant proteinuria in 3rd trimester 12/28/2017  . [redacted] weeks gestation of pregnancy   . Obesity affecting pregnancy in second trimester   . Pregnancy with poor obstetric history   . Thrombocytopenia affecting pregnancy (HCC) 06/28/2017  . Rh negative  state in antepartum period 06/15/2017  . Supervision of other normal pregnancy, antepartum 06/14/2017  . Morbidly obese (HCC) 06/14/2017  . History of pre-eclampsia 06/14/2017  . Nausea/vomiting in pregnancy 06/14/2017    Assessment: Lauren Garcia is a 32 y.o. [id number] at [redacted]w[redacted]d here for IOL for gestational hypertension. Pregnancy complicated by history of pre-eclampsia in all previous pregnancies, Rh negative (received RhoGAM at 28 weeks), chronic thrombocytopenia, and gestational hypertension. GBS positive. Patient desires less providers for delivery, no residents or students.   #Labor: IOL with pit 2x2. Serial cervical exams.  #Pain: Would like to try a natural birth.  #FWB: Cat 1 strip, reactive  #ID: GBS Positive, PCN  #MOF: Breastfeeding  #MOC: Considering BTL vs vasectomy, patient would like to discuss further with husband.  #Circ: Yes   1. Gestational Hypertension without significant proteinuria: Intermittent history of mildly elevated blood pressures during end of pregnancy. BP 141/85 in the office today, however wnl since admission. Associated with HA. LFTs and renal function wnl, protein/cr ratio <6.    -IOL as above   -Monitor BP   -Tylenol for mild pain, percocet for severe PRN   2. Chronic Thrombocytopenia: Chronic, stable, 134 today.   -Monitor CBC if needed after delivery   3. RH Negative: Received RhoGAM at 28 weeks.   -Evaluate for postpartum RhoGAM   Allayne Stack  Family Medicine PGY-1  12/28/2017, 4:18 PM    I confirm that I have verified the information documented in the resident's note and that I have also personally reperformed the physical exam and all medical decision making  activities.   Luna Kitchens

## 2017-12-29 ENCOUNTER — Encounter (HOSPITAL_COMMUNITY): Payer: Self-pay

## 2017-12-29 ENCOUNTER — Other Ambulatory Visit: Payer: Self-pay

## 2017-12-29 DIAGNOSIS — Z3A39 39 weeks gestation of pregnancy: Secondary | ICD-10-CM

## 2017-12-29 DIAGNOSIS — O134 Gestational [pregnancy-induced] hypertension without significant proteinuria, complicating childbirth: Secondary | ICD-10-CM

## 2017-12-29 LAB — RPR: RPR Ser Ql: NONREACTIVE

## 2017-12-29 MED ORDER — MEASLES, MUMPS & RUBELLA VAC ~~LOC~~ INJ
0.5000 mL | INJECTION | Freq: Once | SUBCUTANEOUS | Status: DC
  Filled 2017-12-29: qty 0.5

## 2017-12-29 MED ORDER — DIBUCAINE 1 % RE OINT: 1.0000 "application " | TOPICAL_OINTMENT | RECTAL | Status: DC | PRN

## 2017-12-29 MED ORDER — ONDANSETRON HCL 4 MG/2ML IJ SOLN: 4.0000 mg | INTRAMUSCULAR | Status: DC | PRN

## 2017-12-29 MED ORDER — OXYCODONE HCL 5 MG PO TABS: 5.0000 mg | ORAL_TABLET | ORAL | Status: DC | PRN

## 2017-12-29 MED ORDER — FENTANYL CITRATE (PF) 100 MCG/2ML IJ SOLN
100.0000 ug | INTRAMUSCULAR | Status: DC | PRN
  Administered 2017-12-29 (×2): 100 ug via INTRAVENOUS
  Filled 2017-12-29 (×2): qty 2

## 2017-12-29 MED ORDER — OXYCODONE HCL 5 MG PO TABS: 10.0000 mg | ORAL_TABLET | ORAL | Status: DC | PRN

## 2017-12-29 MED ORDER — BISACODYL 10 MG RE SUPP: 10.0000 mg | Freq: Every day | RECTAL | Status: DC | PRN

## 2017-12-29 MED ORDER — SIMETHICONE 80 MG PO CHEW: 80.0000 mg | CHEWABLE_TABLET | ORAL | Status: DC | PRN

## 2017-12-29 MED ORDER — METHYLERGONOVINE MALEATE 0.2 MG PO TABS: 0.2000 mg | ORAL_TABLET | ORAL | Status: DC | PRN

## 2017-12-29 MED ORDER — BENZOCAINE-MENTHOL 20-0.5 % EX AERO: 1.0000 "application " | INHALATION_SPRAY | CUTANEOUS | Status: DC | PRN

## 2017-12-29 MED ORDER — FERROUS SULFATE 325 (65 FE) MG PO TABS
325.0000 mg | ORAL_TABLET | Freq: Two times a day (BID) | ORAL | Status: DC
  Administered 2017-12-29 – 2017-12-30 (×3): 325 mg via ORAL
  Filled 2017-12-29 (×3): qty 1

## 2017-12-29 MED ORDER — PRENATAL MULTIVITAMIN CH
1.0000 | ORAL_TABLET | Freq: Every day | ORAL | Status: DC
  Administered 2017-12-29 – 2017-12-30 (×2): 1 via ORAL
  Filled 2017-12-29 (×2): qty 1

## 2017-12-29 MED ORDER — ERYTHROMYCIN 5 MG/GM OP OINT
TOPICAL_OINTMENT | OPHTHALMIC | Status: AC
  Filled 2017-12-29: qty 1

## 2017-12-29 MED ORDER — DOCUSATE SODIUM 100 MG PO CAPS
100.0000 mg | ORAL_CAPSULE | Freq: Two times a day (BID) | ORAL | Status: DC
  Administered 2017-12-29 – 2017-12-30 (×2): 100 mg via ORAL
  Filled 2017-12-29 (×2): qty 1

## 2017-12-29 MED ORDER — COCONUT OIL OIL: 1.0000 "application " | TOPICAL_OIL | Status: DC | PRN

## 2017-12-29 MED ORDER — TETANUS-DIPHTH-ACELL PERTUSSIS 5-2.5-18.5 LF-MCG/0.5 IM SUSP: 0.5000 mL | Freq: Once | INTRAMUSCULAR | Status: DC

## 2017-12-29 MED ORDER — FLEET ENEMA 7-19 GM/118ML RE ENEM: 1.0000 | ENEMA | Freq: Every day | RECTAL | Status: DC | PRN

## 2017-12-29 MED ORDER — WITCH HAZEL-GLYCERIN EX PADS: 1.0000 "application " | MEDICATED_PAD | CUTANEOUS | Status: DC | PRN

## 2017-12-29 MED ORDER — IBUPROFEN 600 MG PO TABS
600.0000 mg | ORAL_TABLET | Freq: Four times a day (QID) | ORAL | Status: DC
  Administered 2017-12-29 – 2017-12-30 (×5): 600 mg via ORAL
  Filled 2017-12-29 (×6): qty 1

## 2017-12-29 MED ORDER — METHYLERGONOVINE MALEATE 0.2 MG/ML IJ SOLN: 0.2000 mg | INTRAMUSCULAR | Status: DC | PRN

## 2017-12-29 MED ORDER — ZOLPIDEM TARTRATE 5 MG PO TABS: 5.0000 mg | ORAL_TABLET | Freq: Every evening | ORAL | Status: DC | PRN

## 2017-12-29 MED ORDER — ACETAMINOPHEN 325 MG PO TABS
650.0000 mg | ORAL_TABLET | ORAL | Status: DC | PRN
  Administered 2017-12-29: 650 mg via ORAL
  Filled 2017-12-29: qty 2

## 2017-12-29 MED ORDER — DIPHENHYDRAMINE HCL 25 MG PO CAPS: 25.0000 mg | ORAL_CAPSULE | Freq: Four times a day (QID) | ORAL | Status: DC | PRN

## 2017-12-29 MED ORDER — ONDANSETRON HCL 4 MG PO TABS: 4.0000 mg | ORAL_TABLET | ORAL | Status: DC | PRN

## 2017-12-29 NOTE — Progress Notes (Signed)
AROM w/clear fluid.  FHR Cat   1  IUPC placed. PT doing well.

## 2017-12-29 NOTE — Lactation Note (Signed)
This note was copied from a baby's chart. Lactation Consultation Note  Patient Name: Lauren Garcia [id number] Date: 12/29/2017 Reason for consult: Initial assessment;Term  30 hours old FT female who is being exclusively BF by his mother, she's P4 and experienced BF. She was able to BF her first child for 12 weeks, her second one for 5 weeks, her third one for 1 year (daughter) and plans to BF this baby for at least 6 months. Praised her for her efforts. Mom doesn't have a pump at home, offered one from the hospital. Pump instructions, cleaning and storage were reviewed, as well as milk storage guidelines.  When assisting mom with hand expression, colostrum was easily expressed and it was pouring out of mom's breast. LC showed mom how to  finger feed baby. Per mom child has not fed since 7:30 am, offered assistance with latch and mom agreed to wake baby up to feed. LC checked diaper, no voids or stools yet, and she took baby STS to mom's left breast and she nursed him in cradle position. Multiple audible were noted, baby actively fed for 5 minutes, but then he fell asleep at the breast.  Feeding plan:  1. Encouraged mom to feed baby STS 8-12 times/24 hours or sooner if feeding cues are present 2. If baby is not cueing in a 3 hour period, mom will place him STS at the breast to give him an opportunity to feed 3. Hand expression and spoon feeding was encouraged; as well as pumping  BF brochure, BF resources and feeding diary were reviewed; mom is aware of LC services and will call PRN.    Maternal Data Formula Feeding for Exclusion: No Has patient been taught Hand Expression?: Yes Does the patient have breastfeeding experience prior to this delivery?: Yes  Feeding Feeding Type: Breast Fed  LATCH Score Latch: Repeated attempts needed to sustain latch, nipple held in mouth throughout feeding, stimulation needed to elicit sucking reflex.  Audible Swallowing: Spontaneous and  intermittent  Type of Nipple: Everted at rest and after stimulation  Comfort (Breast/Nipple): Soft / non-tender  Hold (Positioning): No assistance needed to correctly position infant at breast.(minimal assistance needed)  LATCH Score: 9  Interventions Interventions: Breast feeding basics reviewed;Assisted with latch;Skin to skin;Breast massage;Hand express;Breast compression;Adjust position;Hand pump;Support pillows  Lactation Tools Discussed/Used Tools: Pump Breast pump type: Manual WIC Program: No Pump Review: Setup, frequency, and cleaning;Milk Storage Initiated by:: MPeck Date initiated:: 12/29/17   Consult Status Consult Status: Follow-up Date: 12/30/17 Follow-up type: In-patient    Lauren Garcia Lauren Garcia 12/29/2017, 2:35 PM

## 2017-12-29 NOTE — Progress Notes (Signed)
Patient Vitals for the past 4 hrs:  BP Temp Temp src Pulse Resp  12/29/17 0301 116/83 - - 87 16  12/29/17 0231 120/77 98.1 F (36.7 C) Oral 76 16  12/29/17 0213 - - - 100 -  12/29/17 0059 126/84 - - 74 16   cx 4/80/-2 per RN.  Doing well w/IV meds.  Pitocin at 18 mu/min, ctx q 2-4 minutes.  Continue present management

## 2017-12-30 MED ORDER — IBUPROFEN 600 MG PO TABS: 600.0000 mg | ORAL_TABLET | Freq: Three times a day (TID) | ORAL | 0 refills | Status: DC

## 2017-12-30 MED ORDER — RHO D IMMUNE GLOBULIN 1500 UNIT/2ML IJ SOSY
300.0000 ug | PREFILLED_SYRINGE | Freq: Once | INTRAMUSCULAR | Status: AC
  Administered 2017-12-30: 300 ug via INTRAVENOUS
  Filled 2017-12-30: qty 2

## 2017-12-30 MED ORDER — INFLUENZA VAC SPLIT QUAD 0.5 ML IM SUSY
0.5000 mL | PREFILLED_SYRINGE | Freq: Once | INTRAMUSCULAR | Status: AC
  Administered 2017-12-30: 0.5 mL via INTRAMUSCULAR

## 2017-12-30 NOTE — Progress Notes (Signed)
Mother of baby was referred for history of depression.. Referral screened out by CSW because per chart review, MOB's diagnosis is greater than six years old. Per prenatal record, there are no documented occurrences of symptoms being present. No mention of concerns throughout record.  Please contact CSW if mother of baby requests, if needs arise, or if mother of baby scores greater than a nine or answers yes to question ten on [place] Postpartum Depression Screen.   Edwin Dada, MSW, LCSW-A Clinical Social Worker [facility] [phone]

## 2017-12-30 NOTE — Lactation Note (Signed)
This note was copied from a baby's chart. Lactation Consultation Note  Patient Name: Lauren Garcia [id number] Date: 12/30/2017 Reason for consult: Follow-up assessment;Term;Infant weight loss  34 hours old FT female who is being exclusively BF by his mother, she's a P4. Baby is at 4% weight loss, per mom BF is going well, but not as well as yesterday, she's feeling sore today. Offered assistance with latch but mom voiced baby just came back from his circumcision and that he was deeply asleep. Asked mom to call for assistance if needed.   Mom voiced that her right nipple feels sore, LC noticed a tiny scab on the tip of her nipple, but mom's discomfort is concentrated more on the upper areola area. No lumps or swelling were noted upon examination, LC able to express clear colostrum despite the fact that mom also said that she expressed some colostrum that was "redish/bronish" this morning when doing hand expression on her right nipple; the left one is just soreness on the breast area, no really the nipple.  Mom and baby are going home today, reviewed discharge instructions, engorgement prevention and treatment, and treatment for sore nipples. LC also asked RN to get mom some coconut oil for breast care, and instructed mom how to use it prior pumping with her hand pump that she's taking home with her. Mom aware of LC OP services and will contact PRN.  Maternal Data    Feeding   Interventions Interventions: Breast feeding basics reviewed;Breast massage;Hand express;Breast compression;Coconut oil  Lactation Tools Discussed/Used Tools: Coconut oil   Consult Status Consult Status: Complete Date: 12/30/17 Follow-up type: Call as needed    Mykai Wendorf Venetia Constable 12/30/2017, 2:57 PM

## 2017-12-30 NOTE — Discharge Summary (Addendum)
OB Discharge Summary     Patient Name: Lauren Garcia DOB: June 10, 1985 MRN: [id number]   Date of admission: 12/28/2017 Delivering MD: Jacklyn Shell   Date of discharge: 12/30/2017  Admitting diagnosis: 39WKS TEST SENT FROM MD OFFICE Intrauterine pregnancy: [redacted]w[redacted]d     Secondary diagnosis:  Active Problems:   Gestational hypertension w/o significant proteinuria in 3rd trimester  Additional problems: Chronic Thrombocytopenia, RH negative       Discharge diagnosis: Term Pregnancy Delivered, Gestational Hypertension and Thrombocytopenia                                                                                                 Post partum procedures:rhogam  Augmentation: AROM and Pitocin  Complications: None  Hospital course:  Induction of Labor With Vaginal Delivery   32 y.o. yo [id number] at [redacted]w[redacted]d was admitted to the hospital 12/28/2017 for induction of labor.  Indication for induction: Gestational hypertension.  Patient had an uncomplicated labor course as follows: Membrane Rupture Time/Date: 3:50 AM ,12/29/2017   Intrapartum Procedures: Episiotomy: None [1]                                         Lacerations:  1st degree [2]  Patient had delivery of a Viable infant.  Information for the patient's newborn:  Raelin, Pixler [[id number]]  Delivery Method: Vaginal, Spontaneous(Filed from Delivery Summary)   12/29/2017  Details of delivery can be found in separate delivery note.  Patient had a routine postpartum course. Patient is discharged home 12/30/17.  Physical exam  Vitals:   12/29/17 1511 12/29/17 1948 12/29/17 2228 12/30/17 0604  BP: 132/86 112/86 112/75 123/76  Pulse: 75 75 71 79  Resp:  18 18 16   Temp: 98 F (36.7 C) 97.8 F (36.6 C) 97.8 F (36.6 C) 97.7 F (36.5 C)  TempSrc: Oral Oral Oral Oral  SpO2:  100%    Weight:      Height:       General: alert, cooperative and no distress Lochia: appropriate Uterine Fundus:  firm Incision: N/A DVT Evaluation: No evidence of DVT seen on physical exam.  PE per Clayton Bibles CNM on 12/30/2017.    Labs: Lab Results  Component Value Date   WBC 11.7 (H) 12/28/2017   HGB 11.3 (L) 12/28/2017   HCT 33.4 (L) 12/28/2017   MCV 96.5 12/28/2017   PLT 134 (L) 12/28/2017   CMP Latest Ref Rng & Units 12/28/2017  Glucose 70 - 99 mg/dL 73  BUN 6 - 20 mg/dL 11  Creatinine 1.30 - 8.65 mg/dL 7.84  Sodium 696 - 295 mmol/L 135  Potassium 3.5 - 5.1 mmol/L 4.3  Chloride 98 - 111 mmol/L 106  CO2 22 - 32 mmol/L 18(L)  Calcium 8.9 - 10.3 mg/dL 2.8(U)  Total Protein 6.5 - 8.1 g/dL 6.1(L)  Total Bilirubin 0.3 - 1.2 mg/dL 0.7  Alkaline Phos 38 - 126 U/L 162(H)  AST 15 - 41 U/L 19  ALT 0 - 44 U/L 18  Discharge instruction: per After Visit Summary and "Baby and Me Booklet".  After visit meds:  Allergies as of 12/30/2017      Reactions   Gluten Meal Other (See Comments)   Lactose Intolerance (gi) Diarrhea, Nausea And Vomiting      Medication List    STOP taking these medications   triamcinolone ointment 0.5 % Commonly known as:  KENALOG     TAKE these medications   acetaminophen 500 MG tablet Commonly known as:  TYLENOL Take 1,000 mg by mouth every 6 (six) hours as needed for mild pain or headache.   aspirin 81 MG chewable tablet Chew 1 tablet (81 mg total) by mouth daily. What changed:  when to take this   cetirizine 10 MG tablet Commonly known as:  ZYRTEC Take 10 mg by mouth at bedtime.   COMFORT FIT MATERNITY SUPP LG Misc 1 Units by Does not apply route daily.   docusate sodium 100 MG capsule Commonly known as:  COLACE Take 100 mg by mouth at bedtime as needed for mild constipation.   fluticasone 50 MCG/ACT nasal spray Commonly known as:  FLONASE Place 1 spray into both nostrils daily.   ibuprofen 600 MG tablet Commonly known as:  ADVIL,MOTRIN Take 1 tablet (600 mg total) by mouth 3 (three) times daily.   PRENATAL GUMMIES/DHA & FA 0.4-32.5  MG Chew Chew 2 each by mouth at bedtime.       Diet: routine diet  Activity: Advance as tolerated. Pelvic rest for 6 weeks.   Outpatient follow up: 4 weeks, 1 week for BP  Follow up Appt: Future Appointments  Date Time Provider Department Center  01/04/2018 10:30 AM [facility] NURSE [facility] None  01/26/2018  8:45 AM Conan Bowens, MD [facility] None   Follow up Visit:No follow-ups on file.  Postpartum contraception: IUD   Newborn Data: Live born female  Birth Weight: 8 lb 7.3 oz (3836 g) APGAR: 9, 10  Newborn Delivery   Birth date/time:  12/29/2017 04:47:00 Delivery type:  Vaginal, Spontaneous     Baby Feeding: Breast Disposition:home with mother   12/30/2017 Allayne Stack, DO Family Medicine PGY-1   OB FELLOW DISCHARGE ATTESTATION  I agree with above documentation in the resident's note.   Marcy Siren, D.O. OB Fellow  12/30/2017, 8:48 PM

## 2017-12-30 NOTE — Progress Notes (Signed)
Post Partum Day 1, SVD 12/29/17 @ 0447 Subjective: no complaints, up ad lib, voiding, tolerating PO and + flatus  Objective: Blood pressure 123/76, pulse 79, temperature 97.7 F (36.5 C), temperature source Oral, resp. rate 16, height 5\' 6"  (1.676 m), weight 123.4 kg, last menstrual period 03/29/2017, SpO2 100 %, unknown if currently breastfeeding.  Physical Exam:  General: alert, cooperative, appears stated age and no distress Lochia: appropriate Uterine Fundus: firm Incision: N/A DVT Evaluation: No evidence of DVT seen on physical exam.  Recent Labs    12/28/17 1209  HGB 11.3*  HCT 33.4*    Assessment/Plan: Plan for discharge tomorrow   LOS: 2 days   Lauren Garcia, CNM 12/30/2017, 8:56 AM

## 2017-12-31 LAB — RH IG WORKUP (INCLUDES ABO/RH)
ABO/RH(D): O NEG
Fetal Screen: NEGATIVE
GESTATIONAL AGE(WKS): 39.2
Unit division: 0

## 2018-01-04 ENCOUNTER — Encounter: Payer: Managed Care, Other (non HMO) | Admitting: Obstetrics

## 2018-01-04 ENCOUNTER — Ambulatory Visit (INDEPENDENT_AMBULATORY_CARE_PROVIDER_SITE_OTHER): Payer: Managed Care, Other (non HMO)

## 2018-01-04 DIAGNOSIS — G44201 Tension-type headache, unspecified, intractable: Secondary | ICD-10-CM

## 2018-01-04 DIAGNOSIS — Z8759 Personal history of other complications of pregnancy, childbirth and the puerperium: Secondary | ICD-10-CM

## 2018-01-04 DIAGNOSIS — R030 Elevated blood-pressure reading, without diagnosis of hypertension: Secondary | ICD-10-CM

## 2018-01-04 MED ORDER — LABETALOL HCL 200 MG PO TABS: 200.0000 mg | ORAL_TABLET | Freq: Two times a day (BID) | ORAL | 3 refills | Status: DC

## 2018-01-04 MED ORDER — BUTALBITAL-APAP-CAFFEINE 50-325-40 MG PO TABS: 2.0000 | ORAL_TABLET | Freq: Four times a day (QID) | ORAL | 0 refills | Status: DC | PRN

## 2018-01-04 NOTE — Progress Notes (Signed)
Presents for Bp check.  Subjective:  Lauren Garcia is a 32 y.o. female here for 1 week PP BP check.   Hypertension ROS: taking medications as instructed, no medication side effects noted, no TIA's, no chest pain on exertion, no dyspnea on exertion and no swelling of ankles. C/o headaches 4/10 x 2+ weeks.  Objective:  BP 125/80 (BP Location: Left Arm, Cuff Size: Large)   Pulse 72   Ht 5\' 6"  (1.676 m)   Wt 256 lb 9.6 oz (116.4 kg)   Breastfeeding? Yes   BMI 41.42 kg/m   Appearance alert, well appearing, and in no distress. General exam BP noted to be well controlled today in office.    Assessment:   Blood Pressure well controlled.   Plan:  Fioricet given for headaches, return in 1 week for BP check.

## 2018-01-05 LAB — PROTEIN / CREATININE RATIO, URINE
Creatinine, Urine: 14 mg/dL
Protein, Ur: 9.2 mg/dL
Protein/Creat Ratio: 657 mg/g creat — ABNORMAL HIGH (ref 0–200)

## 2018-01-10 ENCOUNTER — Inpatient Hospital Stay (HOSPITAL_COMMUNITY)
Admission: RE | Admit: 2018-01-10 | Discharge: 2018-01-10 | Disposition: A | Discharge status: Home / Self Care | Payer: Managed Care, Other (non HMO) | Source: Ambulatory Visit | Attending: Obstetrics & Gynecology | Admitting: Obstetrics & Gynecology

## 2018-01-11 ENCOUNTER — Ambulatory Visit (INDEPENDENT_AMBULATORY_CARE_PROVIDER_SITE_OTHER): Payer: Managed Care, Other (non HMO)

## 2018-01-11 VITALS — BP 134/86 | HR 76 | Ht 66.0 in | Wt 247.8 lb

## 2018-01-11 DIAGNOSIS — Z0130 Encounter for examination of blood pressure without abnormal findings: Secondary | ICD-10-CM

## 2018-01-11 NOTE — Progress Notes (Signed)
Subjective:  Lauren Garcia is a 32 y.o. female here for BP check.   Hypertension ROS: taking medications as instructed, no medication side effects noted, no TIA's, no chest pain on exertion, no dyspnea on exertion and no swelling of ankles.    Objective:  BP 134/86   Pulse 76   Ht 5\' 6"  (1.676 m)   Wt 247 lb 12.8 oz (112.4 kg)   Breastfeeding? Yes   BMI 40.00 kg/m   Appearance alert, well appearing, and in no distress. General exam BP noted to be well controlled today in office.    Assessment:   Blood Pressure well controlled.   Plan:  Current treatment plan is effective, no change in therapy.Return in 2 weeks for Postpartum Visit.

## 2018-01-26 ENCOUNTER — Other Ambulatory Visit: Payer: Self-pay

## 2018-01-26 ENCOUNTER — Encounter: Payer: Self-pay | Admitting: Obstetrics and Gynecology

## 2018-01-26 ENCOUNTER — Ambulatory Visit (INDEPENDENT_AMBULATORY_CARE_PROVIDER_SITE_OTHER): Payer: Managed Care, Other (non HMO) | Admitting: Obstetrics and Gynecology

## 2018-01-26 DIAGNOSIS — M549 Dorsalgia, unspecified: Secondary | ICD-10-CM

## 2018-01-26 DIAGNOSIS — O99891 Other specified diseases and conditions complicating pregnancy: Secondary | ICD-10-CM

## 2018-01-26 DIAGNOSIS — Z3202 Encounter for pregnancy test, result negative: Secondary | ICD-10-CM | POA: Diagnosis not present

## 2018-01-26 DIAGNOSIS — Z30017 Encounter for initial prescription of implantable subdermal contraceptive: Secondary | ICD-10-CM | POA: Diagnosis not present

## 2018-01-26 DIAGNOSIS — Z1389 Encounter for screening for other disorder: Secondary | ICD-10-CM

## 2018-01-26 DIAGNOSIS — O9989 Other specified diseases and conditions complicating pregnancy, childbirth and the puerperium: Secondary | ICD-10-CM

## 2018-01-26 DIAGNOSIS — O133 Gestational [pregnancy-induced] hypertension without significant proteinuria, third trimester: Secondary | ICD-10-CM

## 2018-01-26 DIAGNOSIS — D696 Thrombocytopenia, unspecified: Secondary | ICD-10-CM

## 2018-01-26 LAB — POCT URINE PREGNANCY: PREG TEST UR: NEGATIVE

## 2018-01-26 MED ORDER — ETONOGESTREL 68 MG ~~LOC~~ IMPL
68.0000 mg | DRUG_IMPLANT | Freq: Once | SUBCUTANEOUS | Status: AC
  Administered 2018-01-26: 68 mg via SUBCUTANEOUS

## 2018-01-26 NOTE — Progress Notes (Signed)
    GYNECOLOGY OFFICE PROCEDURE NOTE  Lauren Garcia is a 32 y.o. [id number] here for Nexplanon insertion.  Last pap smear was on 05/2017 and was normal.  No other gynecologic concerns. Denies unprotected intercourse within the last 14 days. UPT: negative  Reviewed risks of insertion of implant including risk of infection, bleeding, damage to surrounding tissues and organs, migration of implant, difficult removal. She verbalizes understanding and affirms desire to proceed. Consent signed.   Nexplanon Insertion Procedure Patient identified, informed consent performed, consent signed.   Patient does understand that irregular bleeding is a very common side effect of this medication. She was advised to have backup contraception for one week after placement. Pregnancy test in clinic today was negative.  An adequate timeout was performed.  Patient's left arm was prepped and draped in the usual sterile fashion. The ruler used to measure and mark insertion area.  Patient was prepped with alcohol swab and then injected with 3 ml of 1% lidocaine.  She was prepped with betadine, Nexplanon removed from packaging,  Device confirmed in needle, then inserted full length of needle and withdrawn per handbook instructions. Nexplanon was able to palpated in the patient's arm; patient palpated the insert herself. There was minimal blood loss.  Patient insertion site covered with guaze and a pressure bandage to reduce any bruising.  The patient tolerated the procedure well and was given post procedure instructions.      Device Info Exp: 04/2020 Lot#: [id number]  K. Therese Sarah, M.D. [facility] [employer]

## 2018-01-26 NOTE — Progress Notes (Signed)
Obstetrics/Postpartum Visit  Appointment Date: 01/26/2018  OBGYN Clinic: [facility]  Primary Care Provider: Patient, No Pcp Per  Chief Complaint:  Chief Complaint  Patient presents with  . Postpartum Care    History of Present Illness: Lauren Garcia is a 32 y.o. Hispanic [id number] (No LMP recorded.), seen for the above chief complaint. Her past medical history is significant for chronic thrombocytopenia.  She is s/p SVD on 12/29/17 at 39 weeks; she was discharged to home on PPD#1. Pregnancy complicated by gestational HTN, chronic thrombocytopenia.  Complains of low back pain.   Vaginal bleeding or discharge: Yes , spotting Breast or formula feeding: breast Intercourse: No  Contraception: none, desires Nexplanon PP depression s/s: Yes , sometimes feels overwhelmed but feels like she is coping well, has enough support at home, kids give her space when she needs it Any bowel or bladder issues: No  Pap smear: no abnormalities (date: 05/2017)  Review of Systems: Positive for n/a.   Her 12 point review of systems is negative or as noted in the History of Present Illness.  Patient Active Problem List   Diagnosis Date Noted  . Gestational hypertension w/o significant proteinuria in 3rd trimester 12/28/2017  . [redacted] weeks gestation of pregnancy   . Obesity affecting pregnancy in second trimester   . Pregnancy with poor obstetric history   . Thrombocytopenia affecting pregnancy (HCC) 06/28/2017  . Rh negative state in antepartum period 06/15/2017  . Supervision of other normal pregnancy, antepartum 06/14/2017  . Morbidly obese (HCC) 06/14/2017  . History of pre-eclampsia 06/14/2017  . Nausea/vomiting in pregnancy 06/14/2017    Medications Rikita K. Mcgraw had no medications administered during this visit. Current Outpatient Medications  Medication Sig Dispense Refill  . acetaminophen (TYLENOL) 500 MG tablet Take 1,000 mg by mouth every 6 (six) hours as needed for  mild pain or headache.    Marland Kitchen aspirin 81 MG chewable tablet Chew 1 tablet (81 mg total) by mouth daily. (Patient taking differently: Chew 81 mg by mouth at bedtime. ) 30 tablet 12  . butalbital-acetaminophen-caffeine (FIORICET, ESGIC) 50-325-40 MG tablet Take 2 tablets by mouth every 6 (six) hours as needed for headache. 30 tablet 0  . cetirizine (ZYRTEC) 10 MG tablet Take 10 mg by mouth at bedtime.    . docusate sodium (COLACE) 100 MG capsule Take 100 mg by mouth at bedtime as needed for mild constipation.    . Elastic Bandages & Supports (COMFORT FIT MATERNITY SUPP LG) MISC 1 Units by Does not apply route daily. (Patient not taking: Reported on 01/04/2018) 1 each 0  . fluticasone (FLONASE) 50 MCG/ACT nasal spray Place 1 spray into both nostrils daily.     Marland Kitchen ibuprofen (ADVIL,MOTRIN) 600 MG tablet Take 1 tablet (600 mg total) by mouth 3 (three) times daily. 30 tablet 0  . labetalol (NORMODYNE) 200 MG tablet Take 1 tablet (200 mg total) by mouth 2 (two) times daily. 60 tablet 3  . Prenatal MV-Min-FA-Omega-3 (PRENATAL GUMMIES/DHA & FA) 0.4-32.5 MG CHEW Chew 2 each by mouth at bedtime.      Current Facility-Administered Medications  Medication Dose Route Frequency Provider Last Rate Last Dose  . etonogestrel (NEXPLANON) implant 68 mg  68 mg Subdermal Once Conan Bowens, MD        Allergies Gluten meal and Lactose intolerance (gi)  Physical Exam:  BP 122/81   Pulse 86   Ht 5\' 6"  (1.676 m)   Wt 249 lb (112.9 kg)   Breastfeeding? Yes  BMI 40.19 kg/m  Body mass index is 40.19 kg/m. General appearance: Well nourished, well developed female in no acute distress.  Cardiovascular: regular rate and rhythm Respiratory:  Clear to auscultation bilateral. Normal respiratory effort Abdomen: positive bowel sounds and no masses, hernias; diffusely non tender to palpation, non distended Breasts: not examined. Neuro/Psych:  Normal mood and affect.  Skin:  Warm and dry.    PP Depression Screening:    Edinburgh Postnatal Depression Scale - 01/26/18 0907      Edinburgh Postnatal Depression Scale:  In the Past 7 Days   I have been able to laugh and see the funny side of things.  0    I have looked forward with enjoyment to things.  0    I have blamed myself unnecessarily when things went wrong.  0    I have been anxious or worried for no good reason.  0    I have felt scared or panicky for no good reason.  0    Things have been getting on top of me.  0    I have been so unhappy that I have had difficulty sleeping.  0    I have felt sad or miserable.  0    I have been so unhappy that I have been crying.  0    The thought of harming myself has occurred to me.  0    Edinburgh Postnatal Depression Scale Total  0        Assessment: Patient is a 32 y.o. [id number] who is 4 weeks post partum from a SVD. She is doing very well with no issues, desires Nexplanon.   Plan:   1. Postpartum exam Benign exam, doing well  2. Encounter for initial prescription of implantable subdermal contraceptive nexplanon inserted today, please see separate insertion ntoed - POCT urine pregnancy  3. Gestational hypertension, third trimester Normotensive today  4. Back pain affecting pregnancy, antepartum Reviewed strategies for improving pain  5. Thrombocytopenia (HCC)    RTC 1 yr or prn   K. Therese Sarah, M.D. [facility]

## 2018-04-06 ENCOUNTER — Telehealth: Payer: Self-pay

## 2018-04-06 NOTE — Telephone Encounter (Signed)
Pt called requesting a letter stating that she can return to work from postpartum. Letter has been typed. Pt will call back with fax number to where letter needs to be sent.

## 2018-05-09 ENCOUNTER — Telehealth: Payer: Self-pay | Admitting: *Deleted

## 2018-05-09 NOTE — Telephone Encounter (Signed)
Pt called to office with questions about her "implant". Return call to pt. Pt states she has been having really bad cramps but has not started her cycle. Pt made aware that some may have irregularities in there cycle. Pt states this has happened once before but is concerned due to severe cramping. Pt advised that she may try Tylenol, Ibuprofen and Aleve for cramping.  Pt made aware that message would be sent to Dr Earlene Plater for any further recommendations.   Please advise.

## 2018-05-10 ENCOUNTER — Encounter: Payer: Self-pay | Admitting: *Deleted

## 2018-05-10 NOTE — Telephone Encounter (Signed)
Message sent to pt.

## 2018-10-12 ENCOUNTER — Telehealth: Payer: Self-pay | Admitting: *Deleted

## 2018-10-12 NOTE — Telephone Encounter (Signed)
Pt called to office LM stating she is having irregular bleeding with Nexplanon.  Pt states she is having 2-3 week cyces.   Attempt to return call.  LM on VM to call as needed.

## 2018-10-24 ENCOUNTER — Other Ambulatory Visit: Payer: Self-pay

## 2018-10-24 ENCOUNTER — Encounter: Payer: Self-pay | Admitting: Obstetrics & Gynecology

## 2018-10-24 ENCOUNTER — Ambulatory Visit (INDEPENDENT_AMBULATORY_CARE_PROVIDER_SITE_OTHER): Payer: Managed Care, Other (non HMO) | Admitting: Obstetrics & Gynecology

## 2018-10-24 VITALS — BP 131/81 | HR 82 | Temp 97.0°F | Wt 239.9 lb

## 2018-10-24 DIAGNOSIS — Z3046 Encounter for surveillance of implantable subdermal contraceptive: Secondary | ICD-10-CM

## 2018-10-24 DIAGNOSIS — Z30011 Encounter for initial prescription of contraceptive pills: Secondary | ICD-10-CM

## 2018-10-24 MED ORDER — NORETHINDRONE-ETH ESTRADIOL 1-35 MG-MCG PO TABS: 1.0000 | ORAL_TABLET | Freq: Every day | ORAL | 11 refills | Status: AC

## 2018-10-24 NOTE — Progress Notes (Signed)
   GYNECOLOGY OFFICE PROCEDURE NOTE  Lauren Garcia is a 33 y.o. [id number] here for Nexplanon removal due to irregular bleeding and cramping since placement.  Last pap smear was on 06/14/17 and was normal with negative HPV.  No other gynecologic concerns.  Nexplanon Removal Patient identified, informed consent performed, consent signed.   Appropriate time out taken. Nexplanon site identified.  Area prepped in usual sterile fashon. One ml of 1% lidocaine was used to anesthetize the area at the distal end of the implant. A small stab incision was made right beside the implant on the distal portion.  The Nexplanon rod was grasped using hemostats and removed without difficulty.  There was minimal blood loss. There were no complications.  3 ml of 1% lidocaine was injected around the incision for post-procedure analgesia.  Steri-strips were applied over the small incision.  A pressure bandage was applied to reduce any bruising.  The patient tolerated the procedure well and was given post procedure instructions.  Patient is planning to use OCPS for contraception. Condoms recommended for STI prevention and backup contraception for one week. - norethindrone-ethinyl estradiol 1/35 (ORTHO-NOVUM) tablet; Take 1 tablet by mouth daily.  Dispense: 1 Package; Refill: [place], MD, [place] [facility] [employer], [place]

## 2018-10-24 NOTE — Patient Instructions (Signed)
Nexplanon Instructions After Removal   Keep bandage clean and dry for 24 hours   May use ice/Tylenol/Ibuprofen for soreness or pain   If you develop fever, drainage or increased warmth from incision site-contact office immediately    Oral Contraception Use Oral contraceptive pills (OCPs) are medicines that you take to prevent pregnancy. OCPs work by:  Preventing the ovaries from releasing eggs.  Thickening mucus in the lower part of the uterus (cervix), which prevents sperm from entering the uterus.  Thinning the lining of the uterus (endometrium), which prevents a fertilized egg from attaching to the endometrium. OCPs are highly effective when taken exactly as prescribed. However, OCPs do not prevent sexually transmitted infections (STIs). Safe sex practices, such as using condoms while on an OCP, can help prevent STIs. Before taking OCPs, you may have a physical exam, blood test, and Pap test. A Pap test involves taking a sample of cells from your cervix to check for cancer. Discuss with your health care provider the possible side effects of the OCP you may be prescribed. When you start an OCP, be aware that it can take 2-3 months for your body to adjust to changes in hormone levels. How to take oral contraceptive pills Follow instructions from your health care provider about how to start taking your first cycle of OCPs. Your health care provider may recommend that you:  Start the pill on day 1 of your menstrual period. If you start at this time, you will not need any backup form of birth control (contraception), such as condoms.  Start the pill on the first Sunday after your menstrual period or on the day you get your prescription. In these cases, you will need to use backup contraception for the first week.  Start the pill at any time of your cycle. ? If you take the pill within 5 days of the start of your period, you will not need a backup form of contraception. ? If you start at  any other time of your menstrual cycle, you will need to use another form of contraception for 7 days. If your OCP is the type called a minipill, it will protect you from pregnancy after taking it for 2 days (48 hours), and you can stop using backup contraception after that time. After you have started taking OCPs:  If you forget to take 1 pill, take it as soon as you remember. Take the next pill at the regular time.  If you miss 2 or more pills, call your health care provider. Different pills have different instructions for missed doses. Use backup birth control until your next menstrual period starts.  If you use a 28-day pack that contains inactive pills and you miss 1 of the last 7 pills (pills with no hormones), throw away the rest of the non-hormone pills and start a new pill pack. No matter which day you start the OCP, you will always start a new pack on that same day of the week. Have an extra pack of OCPs and a backup contraceptive method available in case you miss some pills or lose your OCP pack. Follow these instructions at home:  Do not use any products that contain nicotine or tobacco, such as cigarettes and e-cigarettes. If you need help quitting, ask your health care provider.  Always use a condom to protect against STIs. OCPs do not protect against STIs.  Use a calendar to mark the days of your menstrual period.  Read the information and directions  that came with your OCP. Talk to your health care provider if you have questions. Contact a health care provider if:  You develop nausea and vomiting.  You have abnormal vaginal discharge or bleeding.  You develop a rash.  You miss your menstrual period. Depending on the type of OCP you are taking, this may be a sign of pregnancy. Ask your health care provider for more information.  You are losing your hair.  You need treatment for mood swings or depression.  You get dizzy when taking the OCP.  You develop acne after  taking the OCP.  You become pregnant or think you may be pregnant.  You have diarrhea, constipation, and abdominal pain or cramps.  You miss 2 or more pills. Get help right away if:  You develop chest pain.  You develop shortness of breath.  You have an uncontrolled or severe headache.  You develop numbness or slurred speech.  You develop visual or speech problems.  You develop pain, redness, and swelling in your legs.  You develop weakness or numbness in your arms or legs. Summary  Oral contraceptive pills (OCPs) are medicines that you take to prevent pregnancy.  OCPs do not prevent sexually transmitted infections (STIs). Always use a condom to protect against STIs.  When you start an OCP, be aware that it can take 2-3 months for your body to adjust to changes in hormone levels.  Read all the information and directions that come with your OCP. This information is not intended to replace advice given to you by your health care provider. Make sure you discuss any questions you have with your health care provider. Document Released: 03/03/2011 Document Revised: 07/06/2018 Document Reviewed: 04/25/2016 Elsevier Patient Education  2020 [employer].

## 2018-12-05 ENCOUNTER — Ambulatory Visit: Payer: Managed Care, Other (non HMO) | Admitting: Obstetrics and Gynecology

## 2021-08-03 ENCOUNTER — Ambulatory Visit: Payer: BLUE CROSS/BLUE SHIELD | Admitting: Allergy & Immunology

## 2021-09-21 ENCOUNTER — Ambulatory Visit: Payer: BLUE CROSS/BLUE SHIELD | Admitting: Allergy & Immunology

## 2022-06-15 ENCOUNTER — Encounter: Payer: Self-pay | Admitting: Internal Medicine

## 2022-06-15 ENCOUNTER — Ambulatory Visit: Payer: BLUE CROSS/BLUE SHIELD | Admitting: Allergy

## 2022-06-15 ENCOUNTER — Other Ambulatory Visit: Payer: Self-pay

## 2022-06-15 ENCOUNTER — Ambulatory Visit: Payer: Medicaid Other | Admitting: Internal Medicine

## 2022-06-15 VITALS — BP 124/78 | HR 78 | Temp 98.4°F | Resp 20 | Ht 65.25 in | Wt 274.7 lb

## 2022-06-15 DIAGNOSIS — J343 Hypertrophy of nasal turbinates: Secondary | ICD-10-CM

## 2022-06-15 DIAGNOSIS — J3089 Other allergic rhinitis: Secondary | ICD-10-CM

## 2022-06-15 MED ORDER — CETIRIZINE HCL 5 MG PO TABS: 5.0000 mg | ORAL_TABLET | Freq: Every day | ORAL | 5 refills | Status: AC

## 2022-06-15 MED ORDER — FLUTICASONE PROPIONATE 50 MCG/ACT NA SUSP: 2.0000 | Freq: Every day | NASAL | 5 refills | Status: AC

## 2022-06-15 MED ORDER — AZELASTINE HCL 0.1 % NA SOLN: 1.0000 | Freq: Two times a day (BID) | NASAL | 5 refills | Status: AC

## 2022-06-15 NOTE — Patient Instructions (Addendum)
Chronic Rhinitis: - Positive skin test 05/2022: none - Avoidance measures discussed. - Use nasal saline rinses before nose sprays such as with Neilmed Sinus Rinse.  Use distilled water.   - Use Flonase 2 sprays each nostril daily. Aim upward and outward. - Use Azelastine 1-2 sprays each nostril twice daily as needed. Aim upward and outward. - Use Zyrtec 5 mg daily.  - If symptoms are not improved at next visit, we can consider an ENT referral.    Adele Dan Return in about 6 weeks (around 07/27/2022).

## 2022-06-15 NOTE — Progress Notes (Signed)
NEW PATIENT  Date of Service/Encounter:  06/15/22  Consult requested by: Patient, No Pcp Per   Subjective:   Lauren Garcia (DOB: Apr 06, 1985) is a 37 y.o. female who presents to the clinic on 06/15/2022 with a chief complaint of Establish Care (Environmental issues year round) .    History obtained from: chart review and patient.   Asthma:  Does have a history of asthma in childhood but thinks she has outgrown in.   Last use of any inhalers (ICS or albuterol) was almost 2 years ago. No ER visits/oral prednisone in over years for breathing.   Rhinitis:  Started in childhood.  Symptoms include:  fatigue, nasal congestion, rhinorrhea, post nasal drainage, sneezing, watery eyes, and itchy eyes  Occurs year-round with seasonal flares in Spring/Fall Potential triggers: pollen  Treatments tried:  Flonase daily Zyrtec daily; last use was 1 week Causes mood swings and foggy   Previous allergy testing: no History of reflux/heartburn: no History of sinus surgery: yes unknown sinus/nose surgery around age 67  Nonallergic triggers: none    Past Medical History: Past Medical History:  Diagnosis Date   Anxiety    Asthma    "sinus induced"   Depression    Pregnancy induced hypertension    with first preg   Sinus infection    Thrombocytopenia complicating pregnancy ([place])    Past Surgical History: Past Surgical History:  Procedure Laterality Date   SINUS EXPLORATION     WISDOM TOOTH EXTRACTION      Family History: Family History  Problem Relation Age of Onset   Diabetes Maternal Grandmother    Hypertension Maternal Grandmother    Cancer Maternal Grandmother    COPD Maternal Grandmother    Arthritis Maternal Grandmother    Arthritis Mother    Diabetes Paternal Grandmother    Hypertension Paternal Grandmother    Arthritis Paternal Grandmother    Anesthesia problems Neg Hx    Other Neg Hx     Social History:  Lives in a 1970 year house Flooring in  bedroom: Charity fundraiser Pets: dog Tobacco use/exposure: none Job: SAHM/full time student  Medication List:  Allergies as of 06/15/2022       Reactions   Gluten Meal Other (See Comments)   Lactose Intolerance (gi) Diarrhea, Nausea And Vomiting        Medication List        Accurate as of June 15, 2022 12:06 PM. If you have any questions, ask your nurse or doctor.          azelastine 0.1 % nasal spray Commonly known as: ASTELIN Place 1 spray into both nostrils 2 (two) times daily. Use in each nostril as directed Started by: Larose Kells, MD   cetirizine 5 MG tablet Commonly known as: ZYRTEC Take 1 tablet (5 mg total) by mouth at bedtime. What changed:  medication strength how much to take Changed by: Larose Kells, MD   fluticasone 50 MCG/ACT nasal spray Commonly known as: FLONASE Place 2 sprays into both nostrils daily. Started by: Larose Kells, MD   MULTI-B COMPLEX PO Take by mouth.   norethindrone-ethinyl estradiol 1/35 tablet Commonly known as: ORTHO-NOVUM Take 1 tablet by mouth daily.         REVIEW OF SYSTEMS: Pertinent positives and negatives discussed in HPI.   Objective:   Physical Exam: BP 124/78 (BP Location: Left Arm, Patient Position: Sitting, Cuff Size: Large)   Pulse 78   Temp 98.4 F (36.9 C) (Temporal)  Resp 20   Ht 5' 5.25" (1.657 m)   Wt 274 lb 11.2 oz (124.6 kg)   SpO2 100%   BMI 45.36 kg/m  Body mass index is 45.36 kg/m. GEN: alert, well developed HEENT: clear conjunctiva, TM grey and translucent, nose with + inferior turbinate hypertrophy, pink nasal mucosa, slight clear rhinorrhea, no cobblestoning HEART: regular rate and rhythm, no murmur LUNGS: clear to auscultation bilaterally, no coughing, unlabored respiration ABDOMEN: soft, non distended  SKIN: no rashes or lesions  Reviewed:  04/30/2020: seen in urgent care due to upper respiratory symptoms and was COVID positive. Discharged home with symptomatic care and strict  precaution to go to the PCP/ER if symptoms worsen.   06/15/2022: clinical summary from [facility] notes she is on Singulair for allergic rhinitis to pollen.   07/15/2015: seen by Dr. Ilene Qua for 2 weeks of throat swelling, headaches.  Tries Zyrtec and benadryl.  Was previously on Singulair which was helping. Give medrol dose pack, triamcinolone injection and Singulair 10mg  daily for seasonal allergic rhinitis due to pollen.   Skin Testing:  Skin prick testing was placed, which includes aeroallergens/foods, histamine control, and saline control.  Verbal consent was obtained prior to placing test.  Patient tolerated procedure well.  Allergy testing results were read and interpreted by myself, documented by clinical staff. Adequate positive and negative control.  Results discussed with patient/family.  Airborne Adult Perc - 06/15/22 1006     Time Antigen Placed 1010    Allergen Manufacturer Greer    Location Back    Number of Test 59    Panel 1 Select    1. Control-Buffer 50% Glycerol Negative    2. Control-Histamine 1 mg/ml 3+    3. Albumin saline Negative    4. [place] Negative    5. [place] Negative    6. Johnson Negative    7. [place] Blue Negative    8. Meadow Fescue Negative    9. Perennial Rye Negative    10. Sweet Vernal Negative    11. Timothy Negative    12. Cocklebur Negative    13. Burweed Marshelder Negative    14. Ragweed, short Negative    15. Ragweed, Giant Negative    16. Plantain,  English Negative    17. Lamb's Quarters Negative    18. Sheep Sorrell Negative    19. Rough Pigweed Negative    20. Marsh Elder, Rough Negative    21. Mugwort, Common Negative    22. Ash mix Negative    23. Birch mix Negative    24. Beech American Negative    25. Box, Elder Negative    26. Cedar, red Negative    27. Cottonwood, [place] Negative    28. Elm mix Negative    29. Hickory Negative    30. Maple mix Negative    31. Oak, [place] mix Negative    32. Pecan Pollen Negative    33.  Pine mix Negative    34. Sycamore Eastern Negative    35. [place], Black Pollen Negative    36. Alternaria alternata Negative    37. Cladosporium Herbarum Negative    38. Aspergillus mix Negative    39. Penicillium mix Negative    40. Bipolaris sorokiniana (Helminthosporium) Negative    41. Drechslera spicifera (Curvularia) Negative    42. Mucor plumbeus Negative    43. Fusarium moniliforme Negative    44. Aureobasidium pullulans (pullulara) Negative    45. Rhizopus oryzae Negative    46. Botrytis cinera  Negative    47. Epicoccum nigrum Negative    48. Phoma betae Negative    49. Candida Albicans Negative    50. Trichophyton mentagrophytes Negative    51. Mite, D Farinae  5,000 AU/ml Negative    52. Mite, D Pteronyssinus  5,000 AU/ml Negative    53. Cat Hair 10,000 BAU/ml Negative    54.  Dog Epithelia Negative    55. Mixed Feathers Negative    56. Horse Epithelia Negative    57. Cockroach, German Negative    58. Mouse Negative    59. Tobacco Leaf Negative             Intradermal - 06/15/22 1034     Time Antigen Placed 1034    Allergen Manufacturer Other    Location Arm    Number of Test 15    Control 3+    [place] Negative    Johnson Negative    7 Grass Negative    Ragweed mix Negative    Weed mix Negative    Tree mix Negative    Mold 1 Negative    Mold 2 Negative    Mold 3 Negative    Mold 4 Negative    Cat Negative    Dog Negative    Cockroach Negative    Mite mix Negative               Assessment:   1. Nasal turbinate hypertrophy   2. Other allergic rhinitis     Plan/Recommendations:  Chronic Rhinitis: - Due to turbinate hypertrophy, seasonal symptoms and unresponsive to OTC meds, performed skin testing to identify aeroallergen triggers.   - Positive skin test 05/2022: none.  Due to negative skin testing, we will confirm with blood test.   - Avoidance measures discussed. - Use nasal saline rinses before nose sprays such as with Neilmed Sinus  Rinse.  Use distilled water.   - Use Flonase 2 sprays each nostril daily. Aim upward and outward. - Use Azelastine 1-2 sprays each nostril twice daily as needed. Aim upward and outward. - Use Zyrtec 5 mg daily.  - If symptoms are not improved at next visit, we can consider an ENT referral.    History of Asthma - No inhaler use in over 2 years.  Likely has outgrown this.     Return in about 6 weeks (around 07/27/2022).  Harlon Flor, MD [facility]

## 2022-07-27 ENCOUNTER — Ambulatory Visit: Payer: BLUE CROSS/BLUE SHIELD | Admitting: Internal Medicine

## 2022-08-10 ENCOUNTER — Ambulatory Visit: Payer: BLUE CROSS/BLUE SHIELD | Admitting: Internal Medicine
